# Patient Record
Sex: Female | Born: 1975 | Hispanic: Yes | Marital: Single | Smoking: Never smoker
Health system: Southern US, Community
[De-identification: ages and names within clinical notes are randomized; demographics above are authoritative.]

## PROBLEM LIST (undated history)

## (undated) DIAGNOSIS — D649 Anemia, unspecified: Secondary | ICD-10-CM

## (undated) DIAGNOSIS — O09529 Supervision of elderly multigravida, unspecified trimester: Secondary | ICD-10-CM

## (undated) DIAGNOSIS — E039 Hypothyroidism, unspecified: Secondary | ICD-10-CM

## (undated) HISTORY — PX: NO PAST SURGERIES: SHX2092

## (undated) HISTORY — DX: Supervision of elderly multigravida, unspecified trimester: O09.529

## (undated) HISTORY — DX: Anemia, unspecified: D64.9

---

## 2008-01-16 ENCOUNTER — Ambulatory Visit: Payer: Self-pay | Admitting: Sports Medicine

## 2008-01-16 ENCOUNTER — Encounter: Payer: Self-pay | Admitting: Family Medicine

## 2008-01-16 LAB — CONVERTED CEMR LAB
Antibody Screen: NEGATIVE
Basophils Relative: 0 % (ref 0–1)
Eosinophils Absolute: 0.2 10*3/uL (ref 0.0–0.7)
Hemoglobin: 11 g/dL — ABNORMAL LOW (ref 12.0–15.0)
MCHC: 31.3 g/dL (ref 30.0–36.0)
MCV: 84.4 fL (ref 78.0–100.0)
Monocytes Absolute: 0.8 10*3/uL (ref 0.1–1.0)
Monocytes Relative: 8 % (ref 3–12)
RBC: 4.16 M/uL (ref 3.87–5.11)
Sickle Cell Screen: NEGATIVE

## 2008-01-23 ENCOUNTER — Ambulatory Visit: Payer: Self-pay | Admitting: Family Medicine

## 2008-01-23 ENCOUNTER — Encounter: Payer: Self-pay | Admitting: Family Medicine

## 2008-01-23 LAB — CONVERTED CEMR LAB
Glucose, Urine, Semiquant: NEGATIVE
Protein, U semiquant: NEGATIVE

## 2008-02-23 ENCOUNTER — Ambulatory Visit: Payer: Self-pay | Admitting: Family Medicine

## 2008-02-23 ENCOUNTER — Encounter: Payer: Self-pay | Admitting: Family Medicine

## 2008-03-05 ENCOUNTER — Ambulatory Visit: Payer: Self-pay | Admitting: Family Medicine

## 2008-03-05 LAB — CONVERTED CEMR LAB: Glucose, Urine, Semiquant: NEGATIVE

## 2008-04-03 ENCOUNTER — Ambulatory Visit: Payer: Self-pay | Admitting: Family Medicine

## 2008-04-03 LAB — CONVERTED CEMR LAB: Glucose, Urine, Semiquant: NEGATIVE

## 2008-04-05 ENCOUNTER — Encounter: Payer: Self-pay | Admitting: Family Medicine

## 2008-05-08 ENCOUNTER — Ambulatory Visit: Payer: Self-pay | Admitting: Family Medicine

## 2008-05-08 LAB — CONVERTED CEMR LAB: Glucose, Urine, Semiquant: NEGATIVE

## 2008-05-29 ENCOUNTER — Encounter: Payer: Self-pay | Admitting: Family Medicine

## 2008-05-29 ENCOUNTER — Ambulatory Visit: Payer: Self-pay | Admitting: Family Medicine

## 2008-05-29 DIAGNOSIS — R635 Abnormal weight gain: Secondary | ICD-10-CM | POA: Insufficient documentation

## 2008-05-29 LAB — CONVERTED CEMR LAB
MCHC: 32 g/dL (ref 30.0–36.0)
MCV: 84 fL (ref 78.0–100.0)
RBC: 3.76 M/uL — ABNORMAL LOW (ref 3.87–5.11)

## 2008-06-04 ENCOUNTER — Encounter: Payer: Self-pay | Admitting: Family Medicine

## 2008-06-11 ENCOUNTER — Ambulatory Visit: Payer: Self-pay | Admitting: Family Medicine

## 2008-06-29 ENCOUNTER — Ambulatory Visit: Payer: Self-pay | Admitting: Family Medicine

## 2008-07-10 ENCOUNTER — Ambulatory Visit: Payer: Self-pay | Admitting: Family Medicine

## 2008-07-10 DIAGNOSIS — D649 Anemia, unspecified: Secondary | ICD-10-CM | POA: Insufficient documentation

## 2008-07-26 ENCOUNTER — Encounter: Payer: Self-pay | Admitting: Family Medicine

## 2008-07-26 ENCOUNTER — Ambulatory Visit: Payer: Self-pay | Admitting: Family Medicine

## 2008-07-26 LAB — CONVERTED CEMR LAB: GC Probe Amp, Genital: NEGATIVE

## 2008-07-27 ENCOUNTER — Encounter: Payer: Self-pay | Admitting: Family Medicine

## 2008-08-01 ENCOUNTER — Ambulatory Visit: Payer: Self-pay | Admitting: Family Medicine

## 2008-08-07 ENCOUNTER — Ambulatory Visit: Payer: Self-pay | Admitting: Family Medicine

## 2008-08-14 ENCOUNTER — Ambulatory Visit: Payer: Self-pay | Admitting: Family Medicine

## 2008-08-21 ENCOUNTER — Encounter: Payer: Self-pay | Admitting: *Deleted

## 2008-08-22 ENCOUNTER — Ambulatory Visit: Payer: Self-pay | Admitting: Obstetrics & Gynecology

## 2008-08-23 ENCOUNTER — Ambulatory Visit: Payer: Self-pay | Admitting: Family Medicine

## 2008-08-23 ENCOUNTER — Encounter: Payer: Self-pay | Admitting: *Deleted

## 2008-08-28 ENCOUNTER — Inpatient Hospital Stay (HOSPITAL_COMMUNITY): Admission: RE | Admit: 2008-08-28 | Discharge: 2008-08-31 | Payer: Self-pay | Admitting: *Deleted

## 2008-08-28 ENCOUNTER — Ambulatory Visit: Payer: Self-pay | Admitting: Family Medicine

## 2008-08-29 ENCOUNTER — Encounter: Payer: Self-pay | Admitting: Obstetrics & Gynecology

## 2010-11-24 LAB — CBC
HCT: 28.5 % — ABNORMAL LOW (ref 36.0–46.0)
MCHC: 32.3 g/dL (ref 30.0–36.0)
MCV: 76.5 fL — ABNORMAL LOW (ref 78.0–100.0)
Platelets: 201 10*3/uL (ref 150–400)
Platelets: 241 10*3/uL (ref 150–400)
RDW: 17.9 % — ABNORMAL HIGH (ref 11.5–15.5)
WBC: 9.1 10*3/uL (ref 4.0–10.5)

## 2010-11-24 LAB — RPR: RPR Ser Ql: NONREACTIVE

## 2010-12-23 NOTE — Discharge Summary (Signed)
Taylor Boyd, Taylor Boyd             ACCOUNT NO.:  [id number]   MEDICAL RECORD NO.:  [id number]          PATIENT TYPE:  INP   LOCATION:  9317                          FACILITY:  [facility]   PHYSICIAN:  Norton Blizzard, MD    DATE OF BIRTH:  [birth year]   DATE OF ADMISSION:  08/28/2008  DATE OF DISCHARGE:  08/31/2008                               DISCHARGE SUMMARY   DISCHARGE DIAGNOSES:  1. Postdates pregnancy, delivered by vacuum-assisted vaginal delivery.  2. Chorioamnionitis.  3. Anemia.   DISCHARGE MEDICATIONS:  1. Prenatal vitamin 1 daily while breast-feeding.  2. Ibuprofen 600 mg 1 every 6 hours as needed for pain.  3. Ferrous sulfate 325 mg one twice a day with food for anemia.  4. Colace 100 mg 1 twice a day for constipation.   BRIEF HOSPITAL COURSE:  Ms. Taylor Boyd was admitted on August 28, 2008, for induction of labor for postdates pregnancy.  She was 41 weeks  and 2 days gestational age, is a G56, now P2-0-1-2 female.  Induction was  begun with a Foley bulb, which was in place for 4 hours and then did  fall out with gentle traction.  Pitocin augmentation was then begun.  The patient was GBS positive and was treated appropriately with  intrapartum penicillin throughout her labor.  During labor, she did end  up developing a fever as high as 101.8.  Ampicillin and gentamicin were  initiated.  The patient reached complete dilation approximately midnight  to 1:00 a.m. on the morning of August 29, 2008.  Around 3:34 in the  morning on August 29, 2008, the baby did begin to show signs of fetal  distress with deep variable decelerations as well as occasional late  decelerations.  Baby also developed fetal tachycardia, we believe  secondary to the mother's fever, however, it did reach as high as to  180s and did remain there.  Due to prolonged second stage labor and  fetal distress, the decision for a vacuum-assisted vaginal delivery was  made.  Please see the delivery note for  full details of the vacuum-  assisted vaginal delivery.  The infant was delivered.  NICU was on hand  due to moderate meconium present at the rupture of her membranes as well  as the signs of fetal distress.  Due to some difficulty breathing and  floppy tone, the infant was taken up to the NICU following delivery.  Ms. Taylor Boyd did have a second-degree perineal laceration which was  repaired using 2-0 Vicryl and blood loss was minimal.  Placenta was  spontaneously delivered intact.  She was transferred to the postpartum  floor and had received routine postpartum care with the addition of iron  sulfate for her postpartum anemia.  On the day of discharge, the patient  has no complaints.  Her pain is well controlled with ibuprofen.  She is  ambulating and eating without any difficulty.  She denies any nausea or  dizziness.   LABS:  Prior to discharge, showed a hemoglobin of 8.1 and hematocrit of  25.1.   DISCHARGE:  The patient will be discharged  home.   CONDITION:  Stable.   FOLLOWUP:  She will see Dr. Ardeen Garland at the [facility] in 6 weeks for her routine postpartum examination.      Ardeen Garland, MD      Norton Blizzard, MD  Electronically Signed    LM/MEDQ  D:  08/31/2008  T:  08/31/2008  Job:  [id number] [id number]

## 2010-12-23 NOTE — Op Note (Signed)
[name], [name]             ACCOUNT NO.:  [id number]   MEDICAL RECORD NO.:  [id number]          PATIENT TYPE:  INP   LOCATION:  9170                          [facility]ILITY:  [facility]   PHYSICIAN:  Norton Blizzard, MD    DATE OF BIRTH:  10-Jan-1976   DATE OF PROCEDURE:  DATE OF DISCHARGE:                               OPERATIVE REPORT   DELIVERY NOTE   DATE OF DELIVERY:  August 29, 2008.   ATTENDING:  Dr. Vela Prose Duru who was present for delivery and repair.   DESCRIPTION OF DELIVERY:  Ms. Taylor Boyd was admitted for induction  of labor secondary to 22 weeks' gestational age.  She progressed over a  prolonged course to complete and after 1-1/2 hours of pushing efforts,  the baby was not descending adequately from the +1 station.  The patient  was also noted to be febrile to 101, and had fetal tachycardia to 180s.  Due to her prolonged second stage, failure to descend and fetal  tachycardia, the patient was counseled on the risks and benefits of a  vacuum-assisted vaginal delivery.  The patient voiced understands of  these risks and desired to proceed with this delivery.  Appropriate  anesthesia was confirmed with the patient as the patient had epidural  anesthesia.  A Foley catheter was in place.  The fetal presentation was  occiput anterior.  The Kiwi vacuum was placed in appropriate location  over the sagittal suture just anterior to the posterior fontanelle.  Over the course of 6 contractions with good maternal pushing efforts,  baby's head was descended until it delivered in a LOA presentation.  There were no pop offs during this time and the use of the vacuum was  over 14 minutes.  The vacuum was removed and a loose double nuchal cord  was noted, which was manually reduced.  The anterior shoulder then  required McRoberts maneuver to be delivered.  It took approximately 25  seconds for delivery of the anterior shoulder.  The posterior shoulder  followed by rest of corpus  delivered without complication at this point.  The mouth and nares were bulb suctioned, and the cord was clamped and  cut and the baby was handed to the awaiting NICU staff.  Apgars were 3  and 9.  Cord arterial pH was collected, which was 7.24.  Cord blood was  sent to lab.  A second-degree laceration was noted, which extended down  to the anal sphincter, but did not involve the sphincter.  The sphincter  was reinforced with 1 figure-of-eight suture with 2-0 Vicryl.  The  second-degree midline laceration was then repaired with 2-0 Vicryl in  the usual manner.  The placenta then delivered after vigorous fundal  massage and was intact.  There was a significant amount of trailing  membranes noted.  After delivery of the membranes, manual sweep at the  uterus revealed small amount of retained membranes that were easily  removed.  The uterus was then noted to be quite firm after  administration of Pitocin bolus.  Estimated blood loss is  approximately 500 mL.  The patient  tolerated the procedure well.  Baby  was taken to the NICU for further observation.  Mother was in good  condition at the conclusion of this delivery.  She was started on  antibiotics during her delivery and will remain on the antiobiotics  until she is afebrile for 24 hours.      Odie Sera, DO  Electronically Signed     ______________________________  Norton Blizzard, MD    MC/MEDQ  D:  08/29/2008  T:  08/29/2008  Job:  [id number]

## 2011-05-12 LAB — GLUCOSE, CAPILLARY: Glucose-Capillary: 88

## 2013-05-29 ENCOUNTER — Ambulatory Visit: Payer: Self-pay | Admitting: Physician Assistant

## 2013-05-29 VITALS — BP 125/82 | HR 77 | Temp 98.5°F | Resp 18 | Ht 64.0 in | Wt 195.0 lb

## 2013-05-29 DIAGNOSIS — Z0000 Encounter for general adult medical examination without abnormal findings: Secondary | ICD-10-CM

## 2013-05-29 DIAGNOSIS — D649 Anemia, unspecified: Secondary | ICD-10-CM

## 2013-05-29 DIAGNOSIS — Z01419 Encounter for gynecological examination (general) (routine) without abnormal findings: Secondary | ICD-10-CM

## 2013-05-29 LAB — POCT CBC
Granulocyte percent: 64.6 %G (ref 37–80)
Hemoglobin: 11.4 g/dL — AB (ref 12.2–16.2)
MCH, POC: 27.8 pg (ref 27–31.2)
MCV: 90.3 fL (ref 80–97)
Platelet Count, POC: 294 10*3/uL (ref 142–424)
RBC: 4.1 M/uL (ref 4.04–5.48)
WBC: 9.4 10*3/uL (ref 4.6–10.2)

## 2013-05-29 LAB — POCT URINALYSIS DIPSTICK
Bilirubin, UA: NEGATIVE
Blood, UA: NEGATIVE
Glucose, UA: NEGATIVE
Nitrite, UA: NEGATIVE
Spec Grav, UA: 1.015
Urobilinogen, UA: 0.2

## 2013-05-29 NOTE — Progress Notes (Signed)
   [address] [place] [address]   Phone [phone]  Subjective:    Patient ID: Taylor Boyd, female    DOB: November 05, 1975, 37 y.o.   MRN: [id number]  HPI  Pt presents to clinic for CPE.  She is not currently having any problems or concerns. Her last pap was about 3 years ago and it was normal - she has never had an abnormal exam.  She does SBE and has noticed nothing.  She has never had a mammogram.  She does not see the dentist or the eye dr.  Review of Systems  Constitutional: Negative.   HENT: Negative.   Eyes: Negative.   Respiratory: Negative.   Cardiovascular: Negative.   Gastrointestinal: Negative.   Endocrine: Negative.   Genitourinary: Negative.   Musculoskeletal: Negative.   Skin: Negative.   Allergic/Immunologic: Negative.   Neurological: Negative.   Hematological: Negative.   Psychiatric/Behavioral: Negative.        Objective:   Physical Exam  Vitals reviewed. Constitutional: She is oriented to person, place, and time. She appears well-developed and well-nourished.  HENT:  Head: Normocephalic and atraumatic.  Right Ear: Hearing, tympanic membrane, external ear and ear canal normal.  Left Ear: Hearing, tympanic membrane, external ear and ear canal normal.  Nose: Nose normal.  Mouth/Throat: Uvula is midline, oropharynx is clear and moist and mucous membranes are normal.  Eyes: Conjunctivae are normal. Pupils are equal, round, and reactive to light.  Neck: Normal range of motion. Neck supple. No thyromegaly present.  Cardiovascular: Normal rate, regular rhythm and normal heart sounds.   Pulmonary/Chest: Effort normal and breath sounds normal.  Abdominal: Soft. Bowel sounds are normal.  Genitourinary: Vagina normal and uterus normal. No breast swelling, tenderness, discharge or bleeding. Pelvic exam was performed with patient supine. No labial fusion. There is no rash, tenderness, lesion or injury on the right labia. There is no rash, tenderness, lesion or  injury on the left labia. Cervix exhibits no motion tenderness, no discharge and no friability. Right adnexum displays no mass, no tenderness and no fullness. Left adnexum displays no mass, no tenderness and no fullness.  Musculoskeletal: Normal range of motion.  Lymphadenopathy:    She has no cervical adenopathy.  Neurological: She is alert and oriented to person, place, and time. She has normal reflexes.  Skin: Skin is warm and dry.  Psychiatric: She has a normal mood and affect. Her behavior is normal. Judgment and thought content normal.       Assessment & Plan:  Annual physical exam - Plan: POCT CBC, POCT urinalysis dipstick, Comprehensive metabolic panel, Lipid panel, TSH  Encounter for routine gynecological examination - Plan: Pap IG and HPV (high risk) DNA detection  Anemia - improved from last visit - pt will use MVI with iron.  Benny Lennert PA-C 05/29/2013 8:49 PM

## 2013-05-30 LAB — TSH: TSH: 0.944 u[IU]/mL (ref 0.350–4.500)

## 2013-05-30 LAB — COMPREHENSIVE METABOLIC PANEL
AST: 15 U/L (ref 0–37)
Albumin: 4.3 g/dL (ref 3.5–5.2)
Alkaline Phosphatase: 89 U/L (ref 39–117)
BUN: 13 mg/dL (ref 6–23)
Calcium: 8.5 mg/dL (ref 8.4–10.5)
Chloride: 104 mEq/L (ref 96–112)
Glucose, Bld: 92 mg/dL (ref 70–99)
Potassium: 3.8 mEq/L (ref 3.5–5.3)
Sodium: 137 mEq/L (ref 135–145)
Total Protein: 6.9 g/dL (ref 6.0–8.3)

## 2013-05-30 LAB — LIPID PANEL: LDL Cholesterol: 82 mg/dL (ref 0–99)

## 2013-06-01 LAB — PAP IG AND HPV HIGH-RISK

## 2013-06-04 ENCOUNTER — Encounter: Payer: Self-pay | Admitting: Family Medicine

## 2014-07-23 ENCOUNTER — Other Ambulatory Visit (HOSPITAL_COMMUNITY): Payer: Self-pay | Admitting: Urology

## 2014-07-23 DIAGNOSIS — O09522 Supervision of elderly multigravida, second trimester: Secondary | ICD-10-CM

## 2014-07-23 DIAGNOSIS — Z3689 Encounter for other specified antenatal screening: Secondary | ICD-10-CM

## 2014-07-23 LAB — OB RESULTS CONSOLE HEPATITIS B SURFACE ANTIGEN: Hepatitis B Surface Ag: NEGATIVE

## 2014-07-23 LAB — OB RESULTS CONSOLE RPR: RPR: NONREACTIVE

## 2014-07-23 LAB — OB RESULTS CONSOLE RUBELLA ANTIBODY, IGM: Rubella: IMMUNE

## 2014-07-23 LAB — OB RESULTS CONSOLE ANTIBODY SCREEN: Antibody Screen: NEGATIVE

## 2014-07-23 LAB — OB RESULTS CONSOLE ABO/RH: RH Type: POSITIVE

## 2014-07-23 LAB — OB RESULTS CONSOLE GC/CHLAMYDIA
Chlamydia: NEGATIVE
GC PROBE AMP, GENITAL: NEGATIVE

## 2014-07-23 LAB — OB RESULTS CONSOLE HIV ANTIBODY (ROUTINE TESTING): HIV: NONREACTIVE

## 2014-08-10 NOTE — L&D Delivery Note (Signed)
Delivery Note At 9:32 AM a viable female was delivered via Vaginal, Spontaneous Delivery (Presentation: Vertex; Occiput Anterior).  APGAR: 7, 9; weight pending.   Placenta status: Intact, Spontaneous.  Cord: 3 vessels with the following complications:  Anterior shoulder dystocia with compound hand x 1 minute. Reduced with McRobert's maneuver, subrapubic pressure and delivery of posterior shoulder.   Anesthesia: Epidural  Episiotomy: None Lacerations: 1st degree Suture Repair: 3.0 vicryl with good hemostasis Est. Blood Loss (mL):  100  Mom to postpartum.  Baby to Couplet care / Skin to Skin.  Delivery supervised and assisted by Joellyn HaffKim Booker, CNM  Fabio AsaMonica T Karrina Lye 01/12/2015, 10:03 AM

## 2014-08-13 ENCOUNTER — Encounter (HOSPITAL_COMMUNITY): Payer: Self-pay

## 2014-08-13 ENCOUNTER — Ambulatory Visit (HOSPITAL_COMMUNITY)
Admission: RE | Admit: 2014-08-13 | Discharge: 2014-08-13 | Disposition: A | Discharge status: Home / Self Care | Payer: Medicaid Other | Source: Ambulatory Visit | Attending: Urology | Admitting: Urology

## 2014-08-13 DIAGNOSIS — O09529 Supervision of elderly multigravida, unspecified trimester: Secondary | ICD-10-CM | POA: Insufficient documentation

## 2014-08-13 DIAGNOSIS — O09522 Supervision of elderly multigravida, second trimester: Secondary | ICD-10-CM

## 2014-08-13 DIAGNOSIS — Z3A19 19 weeks gestation of pregnancy: Secondary | ICD-10-CM | POA: Insufficient documentation

## 2014-08-13 DIAGNOSIS — Z36 Encounter for antenatal screening of mother: Secondary | ICD-10-CM | POA: Insufficient documentation

## 2014-08-13 DIAGNOSIS — Z3689 Encounter for other specified antenatal screening: Secondary | ICD-10-CM | POA: Insufficient documentation

## 2014-08-20 ENCOUNTER — Other Ambulatory Visit (HOSPITAL_COMMUNITY): Payer: Self-pay | Admitting: Nurse Practitioner

## 2014-08-20 DIAGNOSIS — Z0489 Encounter for examination and observation for other specified reasons: Secondary | ICD-10-CM

## 2014-08-20 DIAGNOSIS — IMO0002 Reserved for concepts with insufficient information to code with codable children: Secondary | ICD-10-CM

## 2014-09-07 ENCOUNTER — Ambulatory Visit (HOSPITAL_COMMUNITY)
Admission: RE | Admit: 2014-09-07 | Discharge: 2014-09-07 | Disposition: A | Discharge status: Home / Self Care | Payer: Medicaid Other | Source: Ambulatory Visit | Attending: Nurse Practitioner | Admitting: Nurse Practitioner

## 2014-09-07 DIAGNOSIS — IMO0002 Reserved for concepts with insufficient information to code with codable children: Secondary | ICD-10-CM

## 2014-09-07 DIAGNOSIS — Z36 Encounter for antenatal screening of mother: Secondary | ICD-10-CM | POA: Diagnosis not present

## 2014-09-07 DIAGNOSIS — Z0489 Encounter for examination and observation for other specified reasons: Secondary | ICD-10-CM

## 2014-12-10 LAB — OB RESULTS CONSOLE GC/CHLAMYDIA
CHLAMYDIA, DNA PROBE: NEGATIVE
Gonorrhea: NEGATIVE

## 2014-12-10 LAB — OB RESULTS CONSOLE GBS: GBS: NEGATIVE

## 2015-01-08 ENCOUNTER — Other Ambulatory Visit (HOSPITAL_COMMUNITY): Payer: Self-pay | Admitting: Nurse Practitioner

## 2015-01-08 DIAGNOSIS — O480 Post-term pregnancy: Secondary | ICD-10-CM

## 2015-01-09 ENCOUNTER — Encounter (HOSPITAL_COMMUNITY): Payer: Self-pay | Admitting: *Deleted

## 2015-01-09 ENCOUNTER — Telehealth (HOSPITAL_COMMUNITY): Payer: Self-pay | Admitting: *Deleted

## 2015-01-09 NOTE — Telephone Encounter (Signed)
Preadmission screen Interpreter number [phone]

## 2015-01-11 ENCOUNTER — Encounter (HOSPITAL_COMMUNITY): Payer: Self-pay | Admitting: *Deleted

## 2015-01-11 ENCOUNTER — Inpatient Hospital Stay (HOSPITAL_COMMUNITY)
Admission: AD | Admit: 2015-01-11 | Discharge: 2015-01-13 | DRG: 774 | Disposition: A | Discharge status: Home / Self Care | Payer: Medicaid Other | Source: Ambulatory Visit | Attending: Obstetrics and Gynecology | Admitting: Obstetrics and Gynecology

## 2015-01-11 ENCOUNTER — Ambulatory Visit (HOSPITAL_COMMUNITY): Payer: Medicaid Other

## 2015-01-11 DIAGNOSIS — R030 Elevated blood-pressure reading, without diagnosis of hypertension: Secondary | ICD-10-CM | POA: Diagnosis present

## 2015-01-11 DIAGNOSIS — Z3A40 40 weeks gestation of pregnancy: Secondary | ICD-10-CM | POA: Diagnosis present

## 2015-01-11 DIAGNOSIS — O480 Post-term pregnancy: Secondary | ICD-10-CM | POA: Diagnosis present

## 2015-01-11 DIAGNOSIS — O1092 Unspecified pre-existing hypertension complicating childbirth: Secondary | ICD-10-CM | POA: Diagnosis present

## 2015-01-11 DIAGNOSIS — O163 Unspecified maternal hypertension, third trimester: Secondary | ICD-10-CM | POA: Diagnosis present

## 2015-01-11 DIAGNOSIS — O09523 Supervision of elderly multigravida, third trimester: Secondary | ICD-10-CM | POA: Diagnosis not present

## 2015-01-11 LAB — URINALYSIS, ROUTINE W REFLEX MICROSCOPIC
Bilirubin Urine: NEGATIVE
Glucose, UA: NEGATIVE mg/dL
HGB URINE DIPSTICK: NEGATIVE
KETONES UR: NEGATIVE mg/dL
Leukocytes, UA: NEGATIVE
Nitrite: NEGATIVE
PH: 6 (ref 5.0–8.0)
PROTEIN: NEGATIVE mg/dL
Specific Gravity, Urine: >1.03 — ABNORMAL HIGH (ref 1.005–1.030)
Urobilinogen, UA: 0.2 mg/dL (ref 0.0–1.0)

## 2015-01-11 LAB — COMPREHENSIVE METABOLIC PANEL
ALBUMIN: 3 g/dL — AB (ref 3.5–5.0)
ALK PHOS: 142 U/L — AB (ref 38–126)
ALT: 16 U/L (ref 14–54)
ANION GAP: 7 (ref 5–15)
AST: 19 U/L (ref 15–41)
BUN: 10 mg/dL (ref 6–20)
CALCIUM: 8.6 mg/dL — AB (ref 8.9–10.3)
CO2: 21 mmol/L — ABNORMAL LOW (ref 22–32)
Chloride: 107 mmol/L (ref 101–111)
Creatinine, Ser: 0.44 mg/dL (ref 0.44–1.00)
GFR calc Af Amer: >60 mL/min (ref >60)
GFR calc non Af Amer: >60 mL/min (ref >60)
Glucose, Bld: 86 mg/dL (ref 65–99)
POTASSIUM: 3.7 mmol/L (ref 3.5–5.1)
Sodium: 135 mmol/L (ref 135–145)
Total Bilirubin: 0.4 mg/dL (ref 0.3–1.2)
Total Protein: 6.2 g/dL — ABNORMAL LOW (ref 6.5–8.1)

## 2015-01-11 LAB — CBC
HEMATOCRIT: 33.1 % — AB (ref 36.0–46.0)
HEMOGLOBIN: 11 g/dL — AB (ref 12.0–15.0)
MCH: 28.8 pg (ref 26.0–34.0)
MCHC: 33.2 g/dL (ref 30.0–36.0)
MCV: 86.6 fL (ref 78.0–100.0)
PLATELETS: 213 10*3/uL (ref 150–400)
RBC: 3.82 MIL/uL — AB (ref 3.87–5.11)
RDW: 14.7 % (ref 11.5–15.5)
WBC: 9 10*3/uL (ref 4.0–10.5)

## 2015-01-11 LAB — TYPE AND SCREEN
ABO/RH(D): O POS
ANTIBODY SCREEN: NEGATIVE

## 2015-01-11 LAB — PROTEIN / CREATININE RATIO, URINE
CREATININE, URINE: 116 mg/dL
PROTEIN CREATININE RATIO: 0.17 mg/mg{creat} — AB (ref 0.00–0.15)
TOTAL PROTEIN, URINE: 20 mg/dL

## 2015-01-11 LAB — ABO/RH: ABO/RH(D): O POS

## 2015-01-11 MED ORDER — TERBUTALINE SULFATE 1 MG/ML IJ SOLN: 0.2500 mg | Freq: Once | INTRAMUSCULAR | Status: AC | PRN

## 2015-01-11 MED ORDER — LIDOCAINE HCL (PF) 1 % IJ SOLN
30.0000 mL | INTRAMUSCULAR | Status: DC | PRN
  Filled 2015-01-11: qty 30

## 2015-01-11 MED ORDER — ACETAMINOPHEN 325 MG PO TABS: 650.0000 mg | ORAL_TABLET | ORAL | Status: DC | PRN

## 2015-01-11 MED ORDER — OXYCODONE-ACETAMINOPHEN 5-325 MG PO TABS: 1.0000 | ORAL_TABLET | ORAL | Status: DC | PRN

## 2015-01-11 MED ORDER — MISOPROSTOL 25 MCG QUARTER TABLET
25.0000 ug | ORAL_TABLET | ORAL | Status: DC | PRN
  Administered 2015-01-11: 25 ug via VAGINAL
  Filled 2015-01-11: qty 0.25
  Filled 2015-01-11: qty 1

## 2015-01-11 MED ORDER — LACTATED RINGERS IV SOLN
INTRAVENOUS | Status: DC
  Administered 2015-01-11 – 2015-01-12 (×3): via INTRAVENOUS

## 2015-01-11 MED ORDER — OXYCODONE-ACETAMINOPHEN 5-325 MG PO TABS: 2.0000 | ORAL_TABLET | ORAL | Status: DC | PRN

## 2015-01-11 MED ORDER — CITRIC ACID-SODIUM CITRATE 334-500 MG/5ML PO SOLN: 30.0000 mL | ORAL | Status: DC | PRN

## 2015-01-11 MED ORDER — LACTATED RINGERS IV SOLN
500.0000 mL | INTRAVENOUS | Status: DC | PRN
  Administered 2015-01-12: 500 mL via INTRAVENOUS

## 2015-01-11 MED ORDER — FENTANYL CITRATE (PF) 100 MCG/2ML IJ SOLN
50.0000 ug | INTRAMUSCULAR | Status: DC | PRN
  Administered 2015-01-11: 50 ug via INTRAVENOUS
  Administered 2015-01-12: 75 ug via INTRAVENOUS
  Filled 2015-01-11 (×2): qty 2

## 2015-01-11 MED ORDER — OXYTOCIN 40 UNITS IN LACTATED RINGERS INFUSION - SIMPLE MED
1.0000 m[IU]/min | INTRAVENOUS | Status: DC
  Administered 2015-01-11: 2 m[IU]/min via INTRAVENOUS
  Administered 2015-01-12: 6 m[IU]/min via INTRAVENOUS
  Filled 2015-01-11: qty 1000

## 2015-01-11 MED ORDER — OXYTOCIN 40 UNITS IN LACTATED RINGERS INFUSION - SIMPLE MED: 62.5000 mL/h | INTRAVENOUS | Status: DC

## 2015-01-11 MED ORDER — OXYTOCIN BOLUS FROM INFUSION: 500.0000 mL | INTRAVENOUS | Status: DC

## 2015-01-11 MED ORDER — ONDANSETRON HCL 4 MG/2ML IJ SOLN: 4.0000 mg | Freq: Four times a day (QID) | INTRAMUSCULAR | Status: DC | PRN

## 2015-01-11 NOTE — Progress Notes (Signed)
Patient ID: Taylor GreenMaria Boyd, female   DOB: 05/15/1976, 39 y.o.   MRN: [id number] S: Patient reporting mild contractions. No LOF or VB. Feeling regular fetal movement.   O:  Filed Vitals:   01/11/15 1746  BP: 127/85  Pulse: 88  Temp:   Resp: 20   Dilation: 4 Effacement (%): 40 Cervical Position: Posterior Presentation: Vertex Exam by:: dr Delanna AhmadiLawler [place]  -3   A?P:   39 y.o. [id number] at 2929w5d for induction of labor. Progressing well after cytotec Contractions q2-5 minutes, mild in nature Cervix 4 cm, 40%, vertex.  Bishop score 7 Will start pitocin due to mild contractions for cont induction of labor.   Plan of care discussed with Dorathy KinsmanVirginia Smith, CNM

## 2015-01-11 NOTE — H&P (Signed)
Taylor GreenMaria Boyd is a 39 y.o. female, [id number] presenting at 8210w5d by LMP consistent with 19 wk ultrasound with high blood pressure (140s/80s) at clinic and increased pedal edema. She has not previously had elevated blood pressures. She denies any symptoms of preeclampsia with no headache, mental status change, blurry vision, RUQ, N or vomiting. She has had a little bit of increased swelling in her feet. She continues to feel regular fetal movement. Denies any leakage of fluid or vaginal bleeding.   Maternal Medical History:  Reason for admission: Nausea.    OB History    Gravida Para Term Preterm AB TAB SAB Ectopic Multiple Living   4 2 2  1  1   2      Past Medical History  Diagnosis Date  . Anemia   . AMA (advanced maternal age) multigravida 35+    History reviewed. No pertinent past surgical history. Family History: family history is negative for Alcohol abuse, Arthritis, Asthma, Birth defects, Cancer, COPD, Depression, Diabetes, Drug abuse, Early death, Hearing loss, Heart disease, Hyperlipidemia, Hypertension, Kidney disease, Learning disabilities, Mental illness, Mental retardation, Miscarriages / Stillbirths, Vision loss, Stroke, and Varicose Veins. Social History:  reports that she has never smoked. She does not have any smokeless tobacco history on file. She reports that she does not drink alcohol or use illicit drugs.   Prenatal Transfer Tool  Maternal Diabetes: No Genetic Screening: Normal Maternal Ultrasounds/Referrals: Normal, echogenic focus on LV Fetal Ultrasounds or other Referrals:  None Maternal Substance Abuse:  No Significant Maternal Medications:  None Significant Maternal Lab Results:  Lab values include: Group B Strep negative Other Comments:  None  Review of Systems  Constitutional: Negative for fever and chills.  Eyes: Negative for blurred vision, double vision and photophobia.  Respiratory: Negative for cough and shortness of breath.   Cardiovascular:  Positive for leg swelling. Negative for chest pain.  Gastrointestinal: Negative for nausea, vomiting and abdominal pain.  Genitourinary: Negative for dysuria.  Neurological: Negative for headaches.    Dilation: 2 Effacement (%): Thick Exam by:: V Smith Blood pressure 122/62, pulse 86, temperature 99.1 F (37.3 C), temperature source Oral, resp. rate 16, height 5\' 5"  (1.651 m), weight 90.89 kg (200 lb 6 oz), last menstrual period 04/01/2014. Maternal Exam:  Uterine Assessment: Contractions absent  Abdomen: Patient reports no abdominal tenderness. Fetal presentation: vertex  Introitus: Normal vulva. Normal vagina.    Fetal Exam Fetal Monitor Review: Mode: ultrasound.   Baseline rate: 140.  Variability: moderate (6-25 bpm).   Pattern: accelerations present.       Filed Vitals:   01/11/15 1117  BP: 122/62  Pulse: 86  Temp:   Resp:     Physical Exam  Constitutional: She is oriented to person, place, and time. She appears well-developed and well-nourished.  HENT:  Head: Normocephalic.  Eyes: Conjunctivae are normal.  Cardiovascular: Normal rate, regular rhythm and normal heart sounds.   1+ non pitting edema bilateral feet  Respiratory: Effort normal and breath sounds normal. No respiratory distress. She has no wheezes.  GI: Soft. There is no tenderness.  Neurological: She is alert and oriented to person, place, and time.  Achilles DTR 2+ without clonus  Skin: Skin is warm and dry.  Psychiatric: She has a normal mood and affect.    Prenatal labs: ABO, Rh: O/Positive/-- (12/14 0000) Antibody: Negative (12/14 0000) Rubella: Immune (12/14 0000) RPR: Nonreactive (12/14 0000)  HBsAg: Negative (12/14 0000)  HIV: Non-reactive (12/14 0000)  GBS: Negative (05/02 0000)  Assessment/Plan: A: 39 yo G4P2012 at [redacted]w[redacted]d by LMP consistent with 19 week ultrasound with increased BP and pedal edema in clinic. BP within normal limits on admission. Urine protein:creatinine ratio is not  significantly elevated. SVE 2 cm, thick, soft. Given post-dates and increased swelling, will admit for induction of labor.   P:  Admit to birthing suite Close BP monitoring Cervical ripening with vaginal cytotec May eat now and then clear liquid diet Bedrest with bathroom privileges Fentanyl IV for severe pain Cont expectant management   Patient history, exam, assessment and plan discussed with Dorathy Kinsman, CNM.  Fabio Asa 01/11/2015, 11:50 AM  I was present for the exam and agree with above.  [place], [place] 01/11/2015 12:46 PM

## 2015-01-11 NOTE — Progress Notes (Signed)
Patient ID: Leonie GreenMaria Hernandez, female   DOB: 12/02/1975, 39 y.o.   MRN: [id number] Doing well but starting to feel more pain with contractions  Filed Vitals:   01/11/15 1949 01/11/15 2003 01/11/15 2033 01/11/15 2103  BP: 138/87 128/84 134/86 130/78  Pulse: 84 90 78 80  Temp:  98.8 F (37.1 C)    TempSrc:  Oral    Resp:  20    Height:      Weight:       FHR reassuring with small accels and average variability UCs every 2 min  Dilation: 4 Effacement (%): 40 Cervical Position: Posterior Presentation: Vertex Exam by:: dr Delanna AhmadiLawler  Exam deferred for now  .

## 2015-01-11 NOTE — MAU Note (Signed)
Urine in lab 

## 2015-01-11 NOTE — MAU Note (Signed)
[facility] with elevated BP.

## 2015-01-11 NOTE — Progress Notes (Signed)
I assisted Taylor BoydDelanna AhmadiLawler (resident) with some questions and explanation of plan of care By Orlan LeavensViria Alvarez Spanish Interpreter

## 2015-01-11 NOTE — MAU Provider Note (Signed)
Chief Complaint:  Hypertension   See H&P. ROS  Taylor KinsmanVirginia Gwendolyne Boyd, CNM 01/11/2015 10:54 AM

## 2015-01-11 NOTE — MAU Note (Signed)
[facility] and increased swelling in feet.

## 2015-01-12 ENCOUNTER — Inpatient Hospital Stay (HOSPITAL_COMMUNITY): Payer: Medicaid Other | Admitting: Anesthesiology

## 2015-01-12 ENCOUNTER — Encounter (HOSPITAL_COMMUNITY): Payer: Self-pay | Admitting: *Deleted

## 2015-01-12 DIAGNOSIS — Z3A40 40 weeks gestation of pregnancy: Secondary | ICD-10-CM

## 2015-01-12 DIAGNOSIS — O480 Post-term pregnancy: Secondary | ICD-10-CM

## 2015-01-12 DIAGNOSIS — O09523 Supervision of elderly multigravida, third trimester: Secondary | ICD-10-CM

## 2015-01-12 DIAGNOSIS — O1092 Unspecified pre-existing hypertension complicating childbirth: Secondary | ICD-10-CM

## 2015-01-12 LAB — CBC
HCT: 32.4 % — ABNORMAL LOW (ref 36.0–46.0)
HEMATOCRIT: 35.4 % — AB (ref 36.0–46.0)
HEMOGLOBIN: 12 g/dL (ref 12.0–15.0)
Hemoglobin: 10.8 g/dL — ABNORMAL LOW (ref 12.0–15.0)
MCH: 29.8 pg (ref 26.0–34.0)
MCH: 29 pg (ref 26.0–34.0)
MCHC: 33.9 g/dL (ref 30.0–36.0)
MCHC: 33.3 g/dL (ref 30.0–36.0)
MCV: 87.8 fL (ref 78.0–100.0)
MCV: 86.9 fL (ref 78.0–100.0)
Platelets: 208 10*3/uL (ref 150–400)
Platelets: 182 10*3/uL (ref 150–400)
RBC: 4.03 MIL/uL (ref 3.87–5.11)
RBC: 3.73 MIL/uL — AB (ref 3.87–5.11)
RDW: 14.9 % (ref 11.5–15.5)
RDW: 14.6 % (ref 11.5–15.5)
WBC: 11.6 10*3/uL — ABNORMAL HIGH (ref 4.0–10.5)
WBC: 14.3 10*3/uL — ABNORMAL HIGH (ref 4.0–10.5)

## 2015-01-12 MED ORDER — TETANUS-DIPHTH-ACELL PERTUSSIS 5-2.5-18.5 LF-MCG/0.5 IM SUSP: 0.5000 mL | Freq: Once | INTRAMUSCULAR | Status: DC

## 2015-01-12 MED ORDER — ONDANSETRON HCL 4 MG PO TABS: 4.0000 mg | ORAL_TABLET | ORAL | Status: DC | PRN

## 2015-01-12 MED ORDER — LANOLIN HYDROUS EX OINT: TOPICAL_OINTMENT | CUTANEOUS | Status: DC | PRN

## 2015-01-12 MED ORDER — EPHEDRINE 5 MG/ML INJ
10.0000 mg | INTRAVENOUS | Status: DC | PRN
  Filled 2015-01-12: qty 2

## 2015-01-12 MED ORDER — SENNOSIDES-DOCUSATE SODIUM 8.6-50 MG PO TABS
2.0000 | ORAL_TABLET | ORAL | Status: DC
  Administered 2015-01-12: 2 via ORAL
  Filled 2015-01-12: qty 2

## 2015-01-12 MED ORDER — FENTANYL 2.5 MCG/ML BUPIVACAINE 1/10 % EPIDURAL INFUSION (WH - ANES): 14.0000 mL/h | INTRAMUSCULAR | Status: DC | PRN

## 2015-01-12 MED ORDER — DIPHENHYDRAMINE HCL 25 MG PO CAPS: 25.0000 mg | ORAL_CAPSULE | Freq: Four times a day (QID) | ORAL | Status: DC | PRN

## 2015-01-12 MED ORDER — ONDANSETRON HCL 4 MG/2ML IJ SOLN: 4.0000 mg | INTRAMUSCULAR | Status: DC | PRN

## 2015-01-12 MED ORDER — OXYCODONE-ACETAMINOPHEN 5-325 MG PO TABS: 2.0000 | ORAL_TABLET | ORAL | Status: DC | PRN

## 2015-01-12 MED ORDER — ZOLPIDEM TARTRATE 5 MG PO TABS: 5.0000 mg | ORAL_TABLET | Freq: Every evening | ORAL | Status: DC | PRN

## 2015-01-12 MED ORDER — DIPHENHYDRAMINE HCL 50 MG/ML IJ SOLN: 12.5000 mg | INTRAMUSCULAR | Status: DC | PRN

## 2015-01-12 MED ORDER — LIDOCAINE HCL (PF) 1 % IJ SOLN
INTRAMUSCULAR | Status: DC | PRN
  Administered 2015-01-12: 4 mL
  Administered 2015-01-12: 6 mL

## 2015-01-12 MED ORDER — OXYCODONE-ACETAMINOPHEN 5-325 MG PO TABS: 1.0000 | ORAL_TABLET | ORAL | Status: DC | PRN

## 2015-01-12 MED ORDER — PRENATAL MULTIVITAMIN CH
1.0000 | ORAL_TABLET | Freq: Every day | ORAL | Status: DC
  Administered 2015-01-13: 1 via ORAL
  Filled 2015-01-12: qty 1

## 2015-01-12 MED ORDER — DIBUCAINE 1 % RE OINT: 1.0000 "application " | TOPICAL_OINTMENT | RECTAL | Status: DC | PRN

## 2015-01-12 MED ORDER — WITCH HAZEL-GLYCERIN EX PADS
1.0000 | MEDICATED_PAD | CUTANEOUS | Status: DC | PRN
Start: 2015-01-12 — End: 2015-01-13

## 2015-01-12 MED ORDER — FENTANYL 2.5 MCG/ML BUPIVACAINE 1/10 % EPIDURAL INFUSION (WH - ANES)
14.0000 mL/h | INTRAMUSCULAR | Status: DC | PRN
  Administered 2015-01-12: 14 mL/h via EPIDURAL
  Filled 2015-01-12: qty 125

## 2015-01-12 MED ORDER — IBUPROFEN 600 MG PO TABS
600.0000 mg | ORAL_TABLET | Freq: Four times a day (QID) | ORAL | Status: DC
  Administered 2015-01-12 – 2015-01-13 (×3): 600 mg via ORAL
  Filled 2015-01-12 (×4): qty 1

## 2015-01-12 MED ORDER — BENZOCAINE-MENTHOL 20-0.5 % EX AERO
1.0000 "application " | INHALATION_SPRAY | CUTANEOUS | Status: DC | PRN
  Filled 2015-01-12: qty 56

## 2015-01-12 MED ORDER — SIMETHICONE 80 MG PO CHEW: 80.0000 mg | CHEWABLE_TABLET | ORAL | Status: DC | PRN

## 2015-01-12 MED ORDER — PHENYLEPHRINE 40 MCG/ML (10ML) SYRINGE FOR IV PUSH (FOR BLOOD PRESSURE SUPPORT)
80.0000 ug | PREFILLED_SYRINGE | INTRAVENOUS | Status: DC | PRN
  Administered 2015-01-12: 80 ug via INTRAVENOUS
  Filled 2015-01-12: qty 20
  Filled 2015-01-12: qty 2

## 2015-01-12 MED ORDER — ACETAMINOPHEN 325 MG PO TABS: 650.0000 mg | ORAL_TABLET | ORAL | Status: DC | PRN

## 2015-01-12 NOTE — Progress Notes (Signed)
Patient ID: Taylor Boyd, female   DOB: 04/22/1976, 39 y.o.   MRN: [id number] Comfortable with epidural now  Filed Vitals:   01/12/15 0233 01/12/15 0235 01/12/15 0238 01/12/15 0240  BP:  122/71  125/76  Pulse: 121 113 110 118  Temp:      TempSrc:      Resp: 18     Height:      Weight:        FHR reassuring with early  decels now  UCs every 2 min  Dilation: 8 Effacement (%): 100 Cervical Position: Middle Station: 0 Presentation: Vertex Exam by:: A. Earna Coderuttle, RNC  Anticipate SVD

## 2015-01-12 NOTE — Anesthesia Preprocedure Evaluation (Signed)
Anesthesia Evaluation  Patient identified by MRN, date of birth, ID band Patient awake    Reviewed: Allergy & Precautions, H&P , Patient's Chart, lab work & pertinent test results  Airway Mallampati: II TM Distance: >3 FB Neck ROM: full    Dental  (+) Teeth Intact   Pulmonary  breath sounds clear to auscultation        Cardiovascular hypertension, Rhythm:regular Rate:Normal     Neuro/Psych    GI/Hepatic   Endo/Other    Renal/GU      Musculoskeletal   Abdominal   Peds  Hematology   Anesthesia Other Findings       Reproductive/Obstetrics (+) Pregnancy                          Anesthesia Physical Anesthesia Plan  ASA: II  Anesthesia Plan: Epidural   Post-op Pain Management:    Induction:   Airway Management Planned:   Additional Equipment:   Intra-op Plan:   Post-operative Plan:   Informed Consent: I have reviewed the patients History and Physical, chart, labs and discussed the procedure including the risks, benefits and alternatives for the proposed anesthesia with the patient or authorized representative who has indicated his/her understanding and acceptance.   Dental Advisory Given  Plan Discussed with:   Anesthesia Plan Comments: (Labs checked- platelets confirmed with RN in room. Fetal heart tracing, per RN, reported to be stable enough for sitting procedure. Discussed epidural, and patient consents to the procedure:  included risk of possible headache,backache, failed block, allergic reaction, and nerve injury. This patient was asked if she had any questions or concerns before the procedure started.)        Anesthesia Quick Evaluation  

## 2015-01-12 NOTE — Anesthesia Procedure Notes (Signed)

## 2015-01-12 NOTE — Anesthesia Postprocedure Evaluation (Signed)
  Anesthesia Post-op Note  Patient: Taylor GreenMaria Hernandez  Procedure(s) Performed: * No procedures listed *  Patient Location: Mother/Baby  Anesthesia Type:Epidural  Level of Consciousness: awake and alert   Airway and Oxygen Therapy: Patient Spontanous Breathing  Post-op Pain: mild  Post-op Assessment: Post-op Vital signs reviewed, Patient's Cardiovascular Status Stable, Respiratory Function Stable, No signs of Nausea or vomiting, Pain level controlled, No headache, No residual numbness and No residual motor weakness  Post-op Vital Signs: Reviewed  Last Vitals:  Filed Vitals:   01/12/15 1745  BP: 126/78  Pulse: 105  Temp: 37.3 C  Resp: 20    Complications: No apparent anesthesia complications

## 2015-01-13 ENCOUNTER — Inpatient Hospital Stay (HOSPITAL_COMMUNITY): Admission: RE | Admit: 2015-01-13 | Payer: Medicaid Other | Source: Ambulatory Visit

## 2015-01-13 LAB — RPR: RPR Ser Ql: NONREACTIVE

## 2015-01-13 MED ORDER — IBUPROFEN 600 MG PO TABS: 600.0000 mg | ORAL_TABLET | Freq: Four times a day (QID) | ORAL | Status: DC | PRN

## 2015-01-13 NOTE — Progress Notes (Addendum)
Checked on patients needs. Ordered dinner and breakfast. Assisted RN with interpretation of baby blood work.  Spanish Interpreter

## 2015-01-13 NOTE — Progress Notes (Signed)
Checked on patients needs.  °Spanish Interpreter  °

## 2015-01-13 NOTE — Discharge Instructions (Signed)
Cuidados en el postparto luego de un parto vaginal  (Postpartum Care After Vaginal Delivery) Despus del parto (perodo de postparto), la estada normal en el hospital es de 24-72 horas. Si hubo problemas con el trabajo de parto o el parto, o si tiene otros problemas mdicos, es posible que Patent attorney en el hospital por ms [place].  Mientras est en el hospital, recibir [place] e instrucciones sobre cmo cuidar de usted misma y de su beb recin nacido durante el postparto.  Mientras est en el hospital:   Asegrese de decirle a las enfermeras si siente dolor o Tree surgeon, as como donde Designer, television/film set y Architect.  Si usted tuvo una incisin cerca de la vagina (episiotoma) o si ha tenido Education officer, museum, las enfermeras le pondrn hielo sobre la episiotoma o Psychiatrist. Las bolsas de hielo pueden ayudar a Dietitian y la hinchazn.  Si est amamantando, puede sentir contracciones dolorosas en el tero durante algunas semanas. Esto es normal. Las contracciones ayudan a que el tero vuelva a su tamao normal.  Es normal tener algo de sangrado despus del Placedo.  Durante los primeros 1-3 das despus del parto, el flujo es de color rojo y la cantidad puede ser similar a un perodo.  Es frecuente que el flujo se inicie y se Production assistant, radio.  En los primeros Okay, puede eliminar algunos cogulos pequeos. Informe a las enfermeras si elimina cogulos grandes o aumenta el flujo.  No  elimine los cogulos de sangre por el inodoro antes de que la [employer] vea.  Durante los prximos 3 a [address] despus del parto, el flujo debe ser ms acuoso y rosado o Forensic psychologist.  Chancy Hurter a catorce [employer] del parto, el flujo debe ser una pequea cantidad de secrecin de color blanco amarillento.  La cantidad de flujo disminuir en las primeras semanas despus del parto. El flujo puede detenerse en 6-8 semanas. La mayora de las mujeres no tienen ms flujo a las 12 semanas  despus del [place].  Usted debe cambiar sus apsitos con frecuencia.  Lvese bien las manos con agua y jabn durante al menos 20 segundos despus de cambiar el apsito, usar el bao o antes de Nature conservation officer o Research scientist (life sciences) a su recin nacido.  Usted podr sentir como que tiene que vaciar la vejiga durante las primeras 6-8 horas despus del [place].  En caso de que sienta debilidad, mareo o [place], llame a la enfermera antes de levantarse de la cama por primera vez y antes de tomar una ducha por primera vez.  Dentro de los Coca[employer]a del parto, sus mamas pueden comenzar a estar sensibles y [place]. Esto se llama congestin. La sensibilidad en los senos por lo general desaparece dentro de las 48-72 horas despus de que ocurre la congestin. Tambin puede notar que la [place] se escapa de sus senos. Si no est amamantando no estimule sus pechos. La estimulacin de las mamas hace que sus senos produzcan ms [place].  Pasar tanto tiempo como le sea posible con el beb recin nacido es muy importante. Durante ese tiempo, usted y su beb deben sentirse cerca y conocerse uno al otro. Tener al beb en su habitacin (alojamiento conjunto) ayudar a fortalecer el vnculo con el beb recin nacido.Esto le dar tiempo para conocerlo y atenderlo de [place] cmoda.  Las hormonas se modifican despus del parto. A veces, los cambios hormonales pueden causar tristeza o ganas de llorar por un tiempo. Estos sentimientos  no deben durar ms de Hughes Supplyunos pocos das. Si duran ms que eso, debe hablar con su mdico.  Si lo desea, hable con su mdico acerca de los mtodos de planificacin familiar o mtodos anticonceptivos.  Hable con su mdico acerca de las vacunas. El mdico puede indicarle que se aplique las siguientes vacunas antes de salir del hospital:  [place] and PrincipeVacuna contra el ttanos, la difteria y la tos ferina (Tdap) o el ttanos y la difteria (Td). Es muy importante que usted y su familia (incluyendo a los abuelos) u otras  personas que cuidan al recin nacido estn al da con las vacunas Tdap o Td. Las vacunas Tdap o Td pueden ayudar a proteger al recin nacido de enfermedades.  Inmunizacin contra la rubola.  Inmunizacin contra la varicela.  Inmunizacin contra la gripe. Usted debe recibir esta vacunacin anual si no la ha recibido Academic librariandurante el embarazo. Document Released: 05/24/2007 Document Revised: 04/20/2012 Operating Room ServicesExitCare Patient Information 2015 [place] FargoExitCare, MarylandLLC. This information is not intended to replace advice given to you by your health care provider. Make sure you discuss any questions you have with your health care provider.  Lactancia materna (Breastfeeding) Decidir Museum/gallery exhibitions officeramamantar es una de las mejores elecciones que puede hacer por usted y su beb. El cambio hormonal durante el Psychiatristembarazo produce el desarrollo del tejido mamario y Lesothoaumenta la cantidad y el tamao de los conductos galactforos. Estas hormonas tambin permiten que las protenas, los azcares y las grasas de la sangre produzcan la WPS Resourcesleche materna en las glndulas productoras de Hamletleche. Las hormonas impiden que la leche materna sea liberada antes del nacimiento del beb, adems de impulsar el flujo de leche luego del nacimiento. Una vez que ha comenzado a Museum/gallery exhibitions officeramamantar, Conservation officer, naturepensar en el beb, as Immunologistcomo la succin o Theatre managerel llanto, pueden estimular la liberacin de Grissom AFBleche de las glndulas productoras de Ingallsleche.  LOS BENEFICIOS DE AMAMANTAR Para el beb  La primera leche (calostro) ayuda a Careers information officermejorar el funcionamiento del sistema digestivo del beb.  La leche tiene anticuerpos que ayudan a Radio producerprevenir las infecciones en el beb.  El beb tiene una menor incidencia de asma, alergias y del sndrome de muerte sbita del lactante.  Los nutrientes en la Nicoma Parkleche materna son mejores para el beb que la Deversleche maternizada y estn preparados exclusivamente para cubrir las necesidades del beb.  La leche materna mejora el desarrollo cerebral del beb.  Es menos probable que el beb  desarrolle otras enfermedades, como obesidad infantil, asma o diabetes mellitus de tipo 2. Para usted   La lactancia materna favorece el desarrollo de un vnculo muy especial entre la madre y el beb.  Es conveniente. La leche materna siempre est disponible a la Human resources officertemperatura correcta y es Marysvilleeconmica.  La lactancia materna ayuda a quemar caloras y a perder el peso ganado durante el New Egyptembarazo.  Favorece la contraccin del tero al tamao que tena antes del embarazo de manera ms rpida y disminuye el sangrado (loquios) despus del parto.  La lactancia materna contribuye a reducir Nurse, adultel riesgo de desarrollar diabetes mellitus de tipo 2, osteoporosis o cncer de mama o de ovario en el futuro. SIGNOS DE QUE EL BEB EST HAMBRIENTO Primeros signos de [address]hambre  Aumenta su estado de Lesothoalerta o actividad.  Se estira.  Mueve la cabeza de un lado a otro.  Mueve la cabeza y abre la boca cuando se le toca la mejilla o la comisura de la boca (reflejo de bsqueda).  Aumenta las vocalizaciones, tales como sonidos de succin, se relame los labios,  emite arrullos, suspiros, o chirridos.  Mueve la Jones Apparel Groupmano hacia la boca.  Se chupa con ganas los dedos o las manos. Signos tardos de Fisher Scientifichambre  Est agitado.  Llora de manera intermitente. Signos de AES Corporationhambre extrema Los signos de hambre extrema requerirn que lo calme y lo consuele antes de que el beb pueda alimentarse adecuadamente. No espere a que se manifiesten los siguientes signos de hambre extrema para comenzar a Museum/gallery exhibitions officeramamantar:   Designer, jewelleryAgitacin.  Llanto intenso y fuerte.   Gritos. INFORMACIN BSICA SOBRE LA LACTANCIA MATERNA Iniciacin de la lactancia materna  Encuentre un lugar cmodo para sentarse o acostarse, con un buen respaldo para el cuello y la espalda.  Coloque una almohada o una manta enrollada debajo del beb para acomodarlo a la altura de la mama (si est sentada). Las almohadas para Museum/gallery exhibitions officeramamantar se han diseado especialmente a fin de servir de apoyo  para los brazos y el beb Smithfield Foodsmientras amamanta.  Asegrese de que el abdomen del beb est frente al suyo.  Masajee suavemente la mama. Con las yemas de los dedos, masajee la pared del pecho hacia el pezn en un movimiento circular. Esto estimula el flujo de Crucibleleche. Es posible que Engineer, manufacturing systemsdeba continuar este movimiento mientras amamanta si la leche fluye lentamente.  Sostenga la mama con el pulgar por arriba del pezn y los otros 4 dedos por debajo de la mama. Asegrese de que los dedos se encuentren lejos del pezn y de la boca del beb.  Empuje suavemente los labios del beb con el pezn o con el dedo.  Cuando la boca del beb se abra lo suficiente, acrquelo rpidamente a la mama e introduzca todo el pezn y la zona oscura que lo rodea (areola), tanto como sea posible, dentro de la boca del beb.  Debe haber ms areola visible por arriba del labio superior del beb que por debajo del labio inferior.  La lengua del beb debe estar entre la enca inferior y la Texicomama.  Asegrese de que la boca del beb est en la posicin correcta alrededor del pezn (prendida). Los labios del beb deben crear un sello sobre la mama y estar doblados hacia afuera (invertidos).  Es comn que el beb succione durante 2 a 3 minutos para que comience el flujo de Landingvilleleche materna. Cmo debe prenderse Es muy importante que le ensee al beb cmo prenderse adecuadamente a la mama. Si el beb no se prende adecuadamente, puede causarle dolor en el pezn y reducir la produccin de Bakersfield Country Clubleche materna, y hacer que el beb tenga un escaso aumento de [place] Lake-Letopeso. Adems, si el beb no se prende adecuadamente al pezn, puede tragar aire durante la alimentacin. Esto puede causarle molestias al beb. Hacer eructar al beb al Pilar Platecambiar de mama puede ayudarlo a liberar el aire. Sin embargo, ensearle al beb cmo prenderse a la mama adecuadamente es la mejor manera de evitar que se sienta molesto por tragar Oceanographeraire mientras se alimenta. Signos de que el beb se  ha prendido adecuadamente al pezn:   Payton Doughtyironea o succiona de modo silencioso, sin causarle dolor.  Se escucha que traga cada 3 o 4 succiones.   Hay movimientos musculares por arriba y por delante de sus odos al Printmakersuccionar. Signos de que el beb no se ha prendido Audiological scientistadecuadamente al pezn:   Hace ruidos de succin o de chasquido mientras se alimenta.  Siente dolor en el pezn. Si cree que el beb no se prendi correctamente, deslice el dedo en la comisura de la boca y colquelo  entre las encas del beb para interrumpir la succin. Intente comenzar a amamantar nuevamente. Signos de Fish farm managerlactancia materna exitosa Signos del beb:   Disminuye gradualmente el nmero de succiones o cesa la succin por completo.  Se duerme.  Relaja el cuerpo.  Retiene una pequea cantidad de Kindred Healthcareleche en la boca.  Se desprende solo del pecho. Signos que presenta usted:  Las mamas han aumentado la firmeza, el peso y el tamao 1 a 3 horas despus de Museum/gallery exhibitions officeramamantar.  Estn ms blandas inmediatamente despus de amamantar.  Un aumento del volumen de McIntyreleche, y tambin un cambio en su consistencia y color se producen hacia el quinto da de Tour managerlactancia materna.  Los pezones no duelen, ni estn agrietados ni sangran. Signos de que su beb recibe la cantidad de leche suficiente  Moja al menos 3 paales en 24 horas. La orina debe ser clara y de color amarillo plido a los [address]  [address] de [place]vida.  Defeca al menos 3 veces en 24 horas a los [address]  [address] de [address]ida. La materia fecal debe ser blanda y Pajarito Mesaamarillenta.  Defeca al menos 3 veces en 24 horas a los [address] das de [address]. La materia fecal debe ser grumosa y Hato Candalamarillenta.  No registra una prdida de peso mayor del 10% del peso al nacer durante los primeros 3 [address]  [address]das de [place]vida.  Aumenta de peso un promedio de 4 a 7onzas (113 a 198g) por semana despus de los 4 [address]  [address]das de vida.  Aumenta de Bradfordpeso, Flushingdiariamente, de Hazardmanera uniforme a Glass blower/designerpartir de los 5 [address]  [address] de vida, sin Passenger transport managerregistrar prdida de peso despus de las  2semanas de vida. Despus de alimentarse, es posible que el beb regurgite una pequea cantidad. Esto es frecuente. FRECUENCIA Y DURACIN DE LA LACTANCIA MATERNA El amamantamiento frecuente la ayudar a producir ms Azerbaijanleche y a Education officer, communityprevenir problemas de Engineer, miningdolor en los pezones e hinchazn en las Town Linemamas. Alimente al beb cuando muestre signos de hambre o si siente la necesidad de reducir la congestin de las [place] Hillmamas. Esto se denomina "lactancia a demanda". Evite el uso del chupete mientras trabaja para establecer la lactancia (las primeras 4 a 6 semanas despus del nacimiento del beb). Despus de este perodo, podr ofrecerle un chupete. Las investigaciones demostraron que el uso del chupete durante el primer ao de vida del beb disminuye el riesgo de desarrollar el sndrome de muerte sbita del lactante (SMSL). Permita que el nio se alimente en cada mama todo lo que desee. Contine amamantando al beb hasta que haya terminado de alimentarse. Cuando el beb se desprende o se queda dormido mientras se est alimentando de la primera mama, ofrzcale la segunda. Debido a que, con frecuencia, los recin Sunoconacidos permanecen somnolientos las primeras semanas de vida, es posible que deba despertar al beb para alimentarlo. Los horarios de Acupuncturistlactancia varan de un beb a otro. Sin embargo, las siguientes reglas pueden servir como gua para ayudarla a Lawyergarantizar que el beb se alimenta adecuadamente:  Se puede amamantar a los recin nacidos (bebs de 4 semanas o menos de vida) cada 1 a 3 horas.  No deben transcurrir ms de 3 horas durante el da o 5 horas durante la noche sin que se amamante a los recin nacidos.  Debe amamantar al beb 8 veces como mnimo en un perodo de 24 horas, hasta que comience a introducir slidos en su dieta, a los 6 meses de vida aproximadamente. EXTRACCIN DE Dean Foods CompanyLECHE MATERNA La extraccin y Contractorel almacenamiento de la leche materna le permiten asegurarse de que el  beb se alimente exclusivamente de  Colgate Palmoliveleche materna, aun en momentos en los que no puede amamantar. Esto tiene especial importancia si debe regresar al Aleen Campitrabajo en el perodo en que an est amamantando o si no puede estar presente en los momentos en que el beb debe alimentarse. Su asesor en lactancia puede orientarla sobre cunto tiempo es seguro almacenar Port Alexanderleche materna.  El sacaleche es un aparato que le permite extraer leche de la mama a un recipiente estril. Luego, la leche materna extrada puede almacenarse en un refrigerador o Electrical engineercongelador. Algunos sacaleches son Birdie Riddlemanuales, Delaney Meigsmientras que otros son elctricos. Consulte a su asesor en lactancia qu tipo ser ms conveniente para usted. Los sacaleches se pueden comprar; sin embargo, algunos hospitales y grupos de apoyo a la lactancia materna alquilan Sports coachsacaleches mensualmente. Un asesor en lactancia puede ensearle cmo extraer W. R. Berkleyleche materna manualmente, en caso de que prefiera no usar un sacaleche.  CMO CUIDAR LAS MAMAS DURANTE LA LACTANCIA MATERNA Los pezones se secan, agrietan y duelen durante la Tour managerlactancia materna. Las siguientes recomendaciones pueden ayudarla a Pharmacologistmantener las TEPPCO Partnersmamas humectadas y sanas:  Careers information officervite usar jabn en los pezones.  Use un sostn de soporte. Aunque no son esenciales, las camisetas sin mangas o los sostenes especiales para Museum/gallery exhibitions officeramamantar estn diseados para acceder fcilmente a las mamas, para Museum/gallery exhibitions officeramamantar sin tener que quitarse todo el sostn o la camiseta. Evite usar sostenes con aro o sostenes muy ajustados.  Seque al aire sus pezones durante 3 a 4minutos despus de amamantar al beb.  Utilice solo apsitos de Haematologistalgodn en el sostn para Environmental health practitionerabsorber las prdidas de Hudsonleche. La prdida de un poco de Public Service Enterprise Groupleche materna entre las tomas es normal.  Utilice lanolina sobre los pezones luego de Museum/gallery exhibitions officeramamantar. La lanolina ayuda a mantener la humedad normal de la piel. Si Botswanausa lanolina pura, no tiene que lavarse los pezones antes de volver a Corporate treasureralimentar al beb. La lanolina pura no es txica para el  beb. Adems, puede extraer Beazer Homesmanualmente algunas gotas de [place] Royaleleche materna y Engineer, maintenance (IT)masajear suavemente esa Winn-Dixieleche sobre los pezones, para que la Terltonleche se seque al aire. Durante las primeras semanas despus de dar a luz, algunas mujeres pueden experimentar hinchazn en las mamas (congestin Strandburgmamaria). La congestin puede hacer que sienta las mamas pesadas, calientes y sensibles al tacto. El pico de la congestin ocurre dentro de los 3 a 5 das despus del Hillsboroughparto. Las siguientes recomendaciones pueden ayudarla a Paramedicaliviar la congestin:  Vace por completo las mamas al QUALCOMMamamantar o Environmental health practitionerextraer leche. Puede aplicar calor hmedo en las mamas (en la ducha o con toallas hmedas para manos) antes de Museum/gallery exhibitions officeramamantar o extraer WPS Resourcesleche. Esto aumenta la circulacin y Saint Vincent and the Grenadinesayuda a que la Holyroodleche fluya. Si el beb no vaca por completo las [address] D[address]amas cuando lo [address] Aveamamanta, extraiga la Judsonialeche restante despus de que haya finalizado.  Use un sostn ajustado (para amamantar o comn) o una camiseta sin mangas durante 1 o 2 das para indicar al cuerpo que disminuya ligeramente la produccin de Olneyleche.  Aplique compresas de hielo Yahoo! Incsobre las mamas, a menos que le resulte demasiado incmodo.  Asegrese de que el beb est prendido y se encuentre en la posicin correcta mientras lo alimenta. Si la congestin persiste luego de 48 horas o despus de seguir estas recomendaciones, comunquese con su mdico o un Holiday representativeasesor en lactancia. RECOMENDACIONES GENERALES PARA EL CUIDADO DE LA SALUD DURANTE LA LACTANCIA MATERNA  Consuma alimentos saludables. Alterne comidas y colaciones, y coma 3 de cada una por da. Dado que lo que come  afecta la Colgate Palmoliveleche materna, es posible que algunas comidas hagan que su beb se vuelva ms irritable de lo habitual. Evite comer este tipo de alimentos si percibe que afectan de manera negativa al beb.  Beba leche, jugos de fruta y agua para Patent examinersatisfacer su sed (aproximadamente 10 vasos al Futures traderda).  Descanse con frecuencia, reljese y tome sus vitaminas  prenatales para evitar la fatiga, el estrs y la anemia.  Contine con los autocontroles de la mama.  Evite masticar y fumar tabaco.  Evite el consumo de alcohol y drogas. Algunos medicamentos, que pueden ser perjudiciales para el beb, pueden pasar a travs de la Colgate Palmoliveleche materna. Es importante que consulte a su mdico antes de Medical sales representativetomar cualquier medicamento, incluidos todos los medicamentos recetados y de Sidneyventa libre, as como los suplementos vitamnicos y herbales. Puede quedar embarazada durante la lactancia. Si desea controlar la natalidad, consulte a su mdico cules son las opciones ms seguras para el beb. SOLICITE ATENCIN MDICA SI:   Usted siente que quiere dejar de Museum/gallery exhibitions officeramamantar o se siente frustrada con la lactancia.  Siente dolor en las mamas o en los pezones.  Sus pezones estn agrietados o Water quality scientistsangran.  Sus pechos estn irritados, sensibles o calientes.  Tiene un rea hinchada en cualquiera de las mamas.  Siente escalofros o fiebre.  Tiene nuseas o vmitos.  Presenta una secrecin de otro lquido distinto de la leche materna de los pezones.  Sus mamas no se llenan antes de Museum/gallery exhibitions officeramamantar al beb para el quinto da despus del Gilbertparto.  Se siente triste y deprimida.  El beb est demasiado somnoliento como para comer bien.  El beb tiene problemas para dormir.  Moja menos de 3 paales en 24 horas.  Defeca menos de 3 veces en 24 horas.  La piel del beb o la parte blanca de los ojos se vuelven amarillentas.  El beb no ha aumentado de Buenapeso a los [address] das de [place]vida. SOLICITE ATENCIN MDICA DE INMEDIATO SI:   El beb est muy cansado Retail buyer(letargo) y no se quiere despertar para comer.  Le sube la fiebre sin causa. Document Released: 07/27/2005 Document Revised: 08/01/2013 [facility] Med CenterExitCare Patient Information 2015 [place] JacintoExitCare, MarylandLLC. This information is not intended to replace advice given to you by your health care provider. Make sure you discuss any questions you have with your health care  provider.

## 2015-01-13 NOTE — Progress Notes (Signed)
Checked on patients needs.  Ordered patient dinner and breakfast. Spanish Interpreter

## 2015-01-13 NOTE — Progress Notes (Signed)
Assisted RN with interpretation of baby analysis.  Spanish Interpreter

## 2015-01-13 NOTE — Discharge Summary (Signed)
Obstetric Discharge Summary Reason for Admission: induction of labor d/t elevated bp in clinic at 40.5  Prenatal Procedures: ultrasound Intrapartum Procedures: spontaneous vaginal delivery Postpartum Procedures: none Complications-Operative and Postpartum: 1st degree perineal laceration w/ repair for hemostasis Eating, drinking, voiding, ambulating well.  +flatus.  Lochia and pain wnl.  Denies dizziness, lightheadedness, or sob. No complaints. Requests early d/c  Hospital Course: Taylor Boyd is a 39 y.o. [phone] female admited at 40.5wks for IOL d/t elevated bp in clinic. Pre-e labs were normal. She received cytotec then pitocin and progressed to svb complicated by 1min shoulder dystocia w/o neonatal deficits. Her pp course has been uncomplicated.  By PPD#1 she is doing well, requests early d/c, and is deemed to have received the full benefit of her hospital stay.  Filed Vitals:   01/13/15 0602  BP: 114/85  Pulse: 72  Temp: 98 F (36.7 C)  Resp: 18   H/H: Lab Results  Component Value Date/Time   HGB 10.8* 01/12/2015 10:00 AM   HGB 11.4* 05/29/2013 08:04 PM   HCT 32.4* 01/12/2015 10:00 AM   HCT 37.0* 05/29/2013 08:04 PM    Physical Exam: General: alert, cooperative and no distress Abdomen/Uterine Fundus: Appropriately tender, non-distended, FF @ U-2 Incision: n/a Lochia: appropriate Extremities: No evidence of DVT seen on physical exam. Negative Homan's sign, no cords, calf tenderness, or significant calf/ankle edema   Discharge Diagnoses: Term Pregnancy-delivered  Discharge Information: Date: 02/19/2011 Activity: pelvic rest Diet: routine  Medications: PNV and Ibuprofen Breast feeding: Yes Contraception: condoms Circumcision: n/a Condition: stable Instructions: refer to handout Discharge to: home  Infant: Home with mother  Follow-up Information    Schedule an appointment as soon as possible for a visit with [facility] [place].   Why:  4-6 weeks for your  postpartum visit   Contact information:   [address] [facility] [place] [id number] [phone]      Marge DuncansBooker, Bodey Frizell Randall, CNM, [facility] Hospital Of MiamiWHNP-BC 01/13/2015,7:23 AM

## 2015-01-13 NOTE — Progress Notes (Signed)
Assisted RN with interpretation of breastfeeding instructions.  Spanish Interpreter

## 2015-01-13 NOTE — Progress Notes (Signed)
Checked on patients needs.  °Spanish interpreter  °

## 2015-01-14 ENCOUNTER — Ambulatory Visit: Payer: Self-pay

## 2015-01-14 ENCOUNTER — Other Ambulatory Visit: Payer: Self-pay | Admitting: Obstetrics and Gynecology

## 2015-01-14 NOTE — Progress Notes (Signed)
Post discharge chart review completed.  

## 2015-01-14 NOTE — Lactation Note (Addendum)
This note was copied from the chart of [name]. Lactation Consultation Note Experienced BF mom has BF her other 2 children for over 1 yr. Each. Did both breast and bottle at first then when milk comes in good just breast. Is breast and bottle feeding this baby as well. Moms breast are filling. INTERPRETER present asked mom questions or concerns, also did teaching  About supplementing, putting baby to breast first, supply and demand, engorgement, BF, milk supply, breast care and massage. Mom encouraged to feed baby 8-12 times/24 hours and with feeding cues. Referred to Baby and Me Book in Breastfeeding section Pg. 22-23 for position options and Proper latch demonstration. Educated about newborn behavior in Spanish book. WH/LC brochure given w/resources, support groups and LC services. Patient Name: [name] ZOXWR'UToday's Date: 01/14/2015 Reason for consult: Initial assessment   Maternal Data Has patient been taught Hand Expression?: Yes Does the patient have breastfeeding experience prior to this delivery?: Yes  Feeding Feeding Type: Breast Fed Length of feed: 10 min  LATCH Score/Interventions Latch: Grasps breast easily, tongue down, lips flanged, rhythmical sucking.  Audible Swallowing: Spontaneous and intermittent  Type of Nipple: Everted at rest and after stimulation  Comfort (Breast/Nipple): Filling, red/small blisters or bruises, mild/mod discomfort  Problem noted: Mild/Moderate discomfort;Filling  Hold (Positioning): Assistance needed to correctly position infant at breast and maintain latch. Intervention(s): Breastfeeding basics reviewed;Support Pillows;Position options;Skin to skin (w/interpreter)  LATCH Score: 8  Lactation Tools Discussed/Used WIC Program: Yes   Consult Status Consult Status: Complete Date: 01/14/15    Charyl DancerCARVER, Luisenrique Conran G 01/14/2015, 2:08 AM

## 2016-07-19 IMAGING — US US OB FOLLOW-UP
1 series · 12 of 28 positions shown · non-contrast
Comparison: none

[Series 1: us ob follow up · 94 acquisitions, 12 frames shown]
[im 4/94]
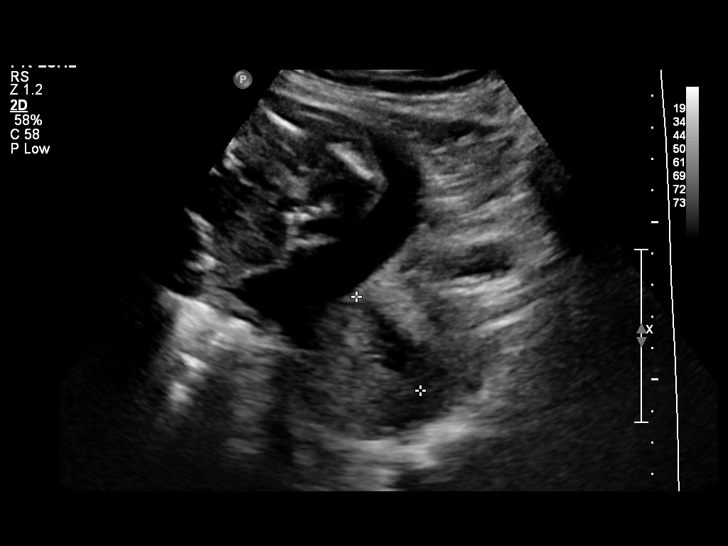
[im 11/94]
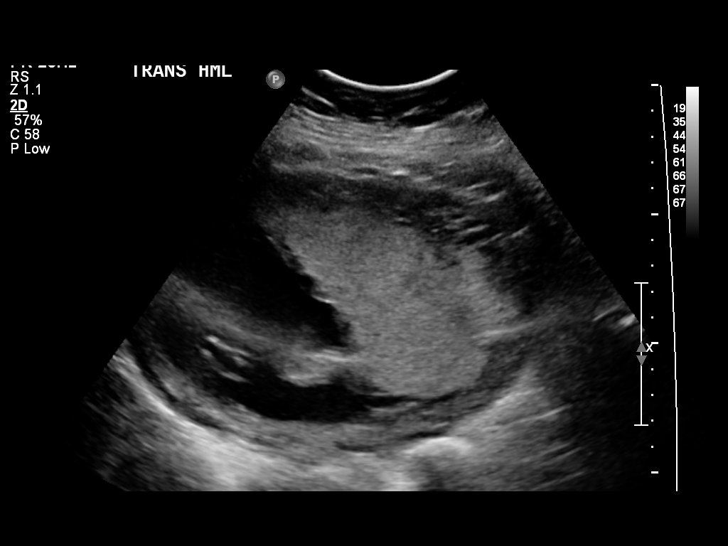
[im 18/94]
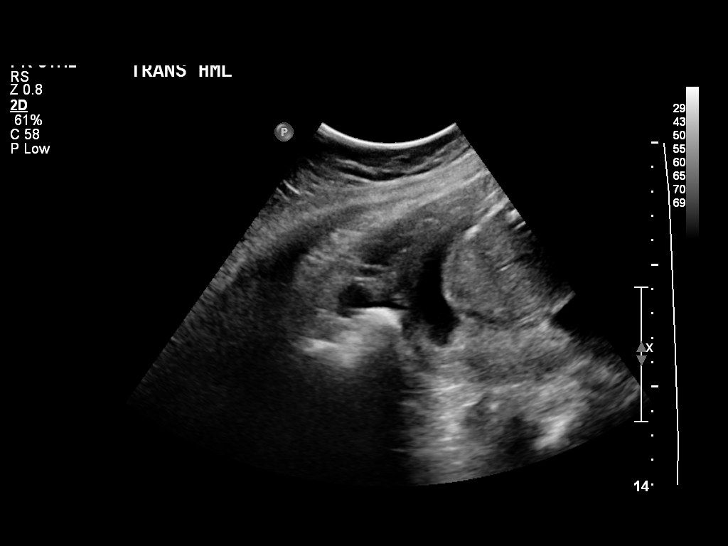
[im 28/94]
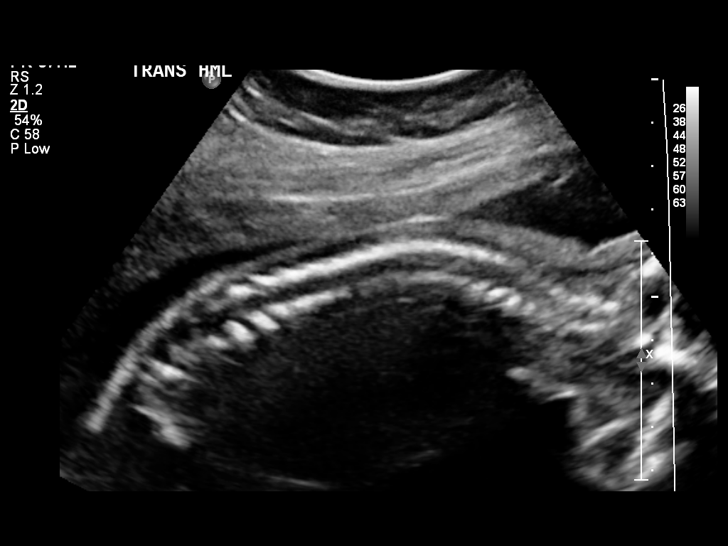
[im 35/94]
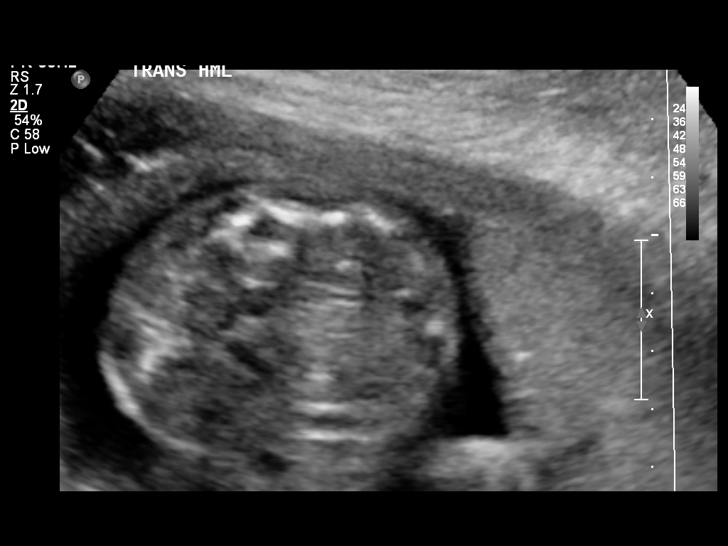
[im 42/94]
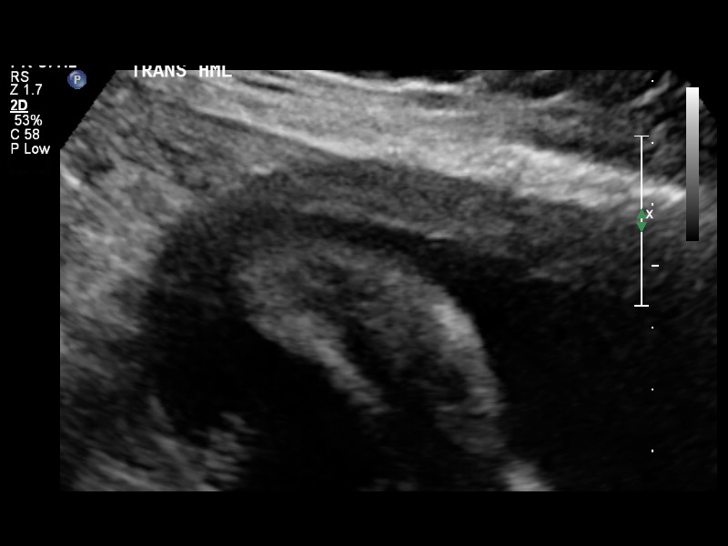
[im 52/94]
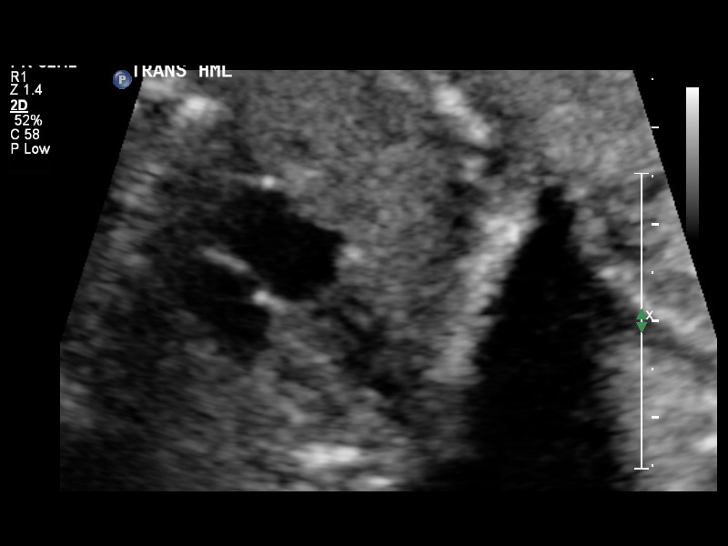
[im 59/94]
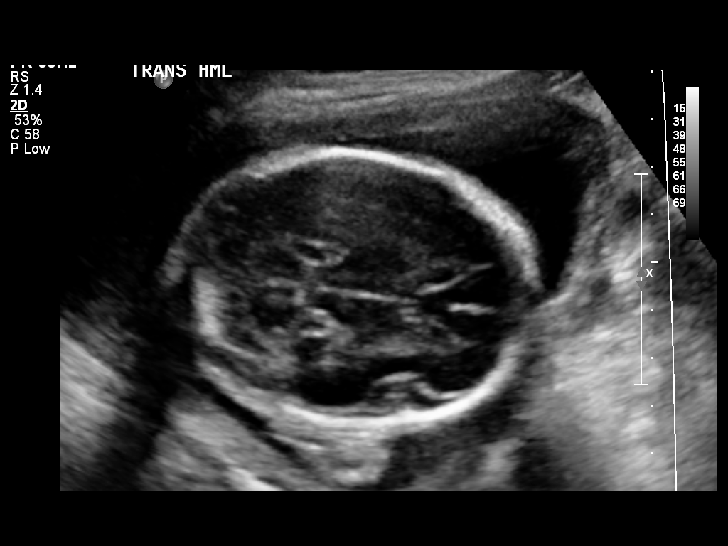
[im 66/94]
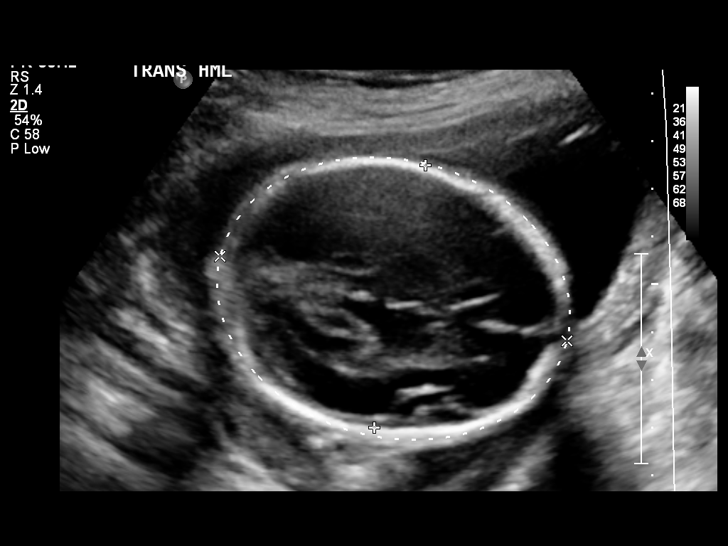
[im 76/94]
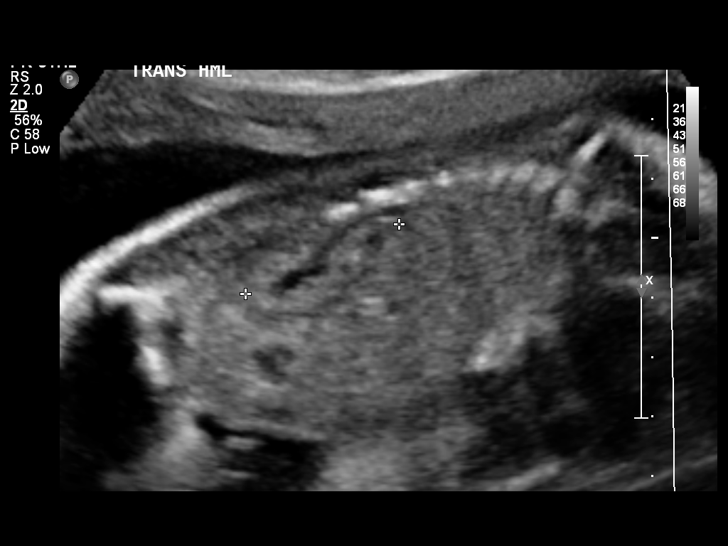
[im 83/94]
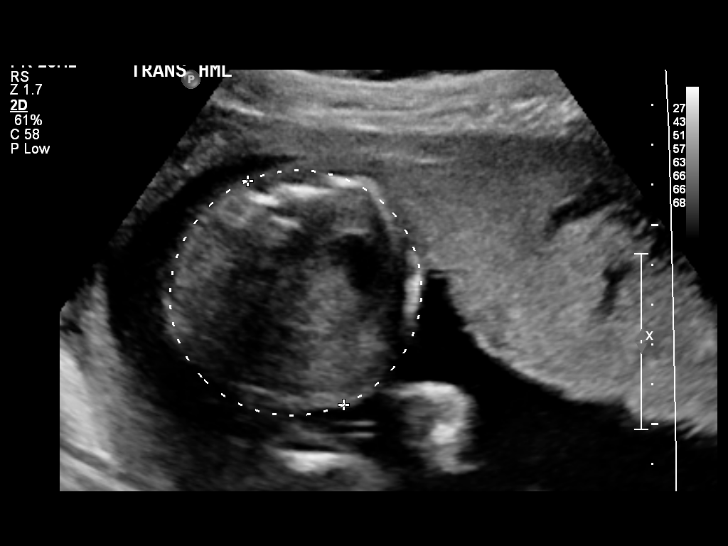
[im 90/94]
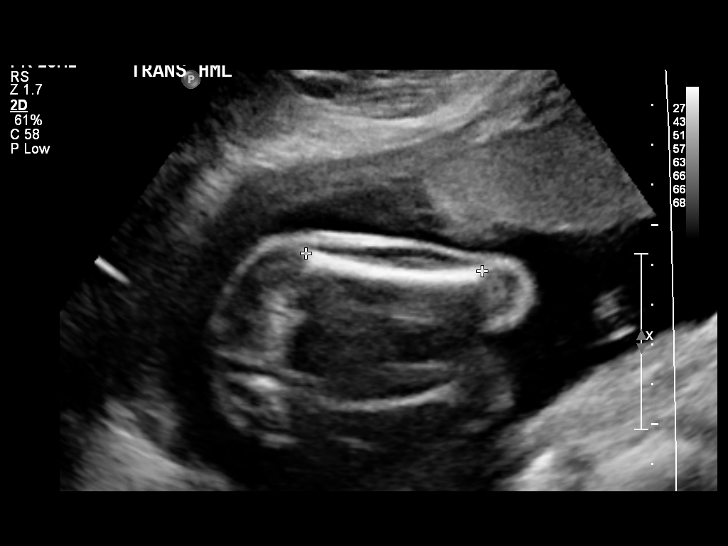

[12 of 28 positions shown; findings below may reference images not displayed]

OBSTETRICS REPORT
                      (Signed Final 09/07/2014 [DATE])

                                                         Faculty Physician
Service(s) Provided

 US OB FOLLOW UP                                       76816.1
Indications

 Advanced maternal age multigravida 36, second
 trimester
 Follow-up incomplete fetal anatomic evaluation        Z36
 22 weeks gestation of pregnancy
Fetal Evaluation

 Num Of Fetuses:    1
 Fetal Heart Rate:  152                          bpm
 Cardiac Activity:  Observed
 Presentation:      Transverse, head to
                    maternal left
 Placenta:          Anterior, above cervical os
 P. Cord            Visualized
 Insertion:

 Amniotic Fluid
 AFI FV:      Subjectively within normal limits
                                             Larg Pckt:     5.1  cm
Biometry

 BPD:     55.7  mm     G. Age:  23w 0d                CI:        71.01   70 - 86
                                                      FL/HC:      20.6   19.2 -

 HC:     210.6  mm     G. Age:  23w 1d       54  %    HC/AC:      1.09   1.05 -

 AC:       194  mm     G. Age:  24w 1d       82  %    FL/BPD:     77.9   71 - 87
 FL:      43.4  mm     G. Age:  24w 2d       84  %    FL/AC:      22.4   20 - 24
 HUM:       39  mm     G. Age:  23w 6d       71  %
 CER:     22.9  mm     G. Age:  21w 3d       23  %

 Est. FW:     650  gm      1 lb 7 oz     71  %
Gestational Age

 LMP:           22w 5d        Date:  04/01/14                 EDD:   01/06/15
 U/S Today:     23w 4d                                        EDD:   12/31/14
 Best:          22w 5d     Det. By:  LMP  (04/01/14)          EDD:   01/06/15
Anatomy

 Cranium:          Appears normal         Ductal Arch:      Appears normal
 Fetal Cavum:      Appears normal         Diaphragm:        Appears normal
 Ventricles:       Appears normal         Stomach:          Appears normal, left
                                                            sided
 Choroid Plexus:   Appears normal         Abdomen:          Appears normal
 Cerebellum:       Appears normal         Abdominal Wall:   Appears nml (cord
                                                            insert, abd wall)
 Posterior Fossa:  Appears normal         Cord Vessels:     Previously seen
 Nuchal Fold:      Not applicable (>20    Kidneys:          Appear normal
                   wks GA)
 Lips:             Previously seen        Bladder:          Appears normal
 Heart:            Echogenic focus        Spine:            Appears normal
                   in LV
 RVOT:             Appears normal         Lower             Previously seen
                                          Extremities:
 LVOT:             Appears normal         Upper             Previously seen
                                          Extremities:
 Aortic Arch:      Appears normal

 Other:  Fetus appears to be a female.
Targeted Anatomy

 Fetal Central Nervous System
 Lat. Ventricles:  4.3                    Cisterna Magna:
Cervix Uterus Adnexa

 Cervical Length:    3.59     cm

 Cervix:       Normal appearance by transabdominal scan.
 Uterus:       No abnormality visualized.
 Left Ovary:    Not visualized.
 Right Ovary:   Not visualized.
 Adnexa:     No adnexal mass visualized.
Impression

 Single IUP at 22w 5d
 Echogenic intracardiac focus is again noted
 Normal interval anatomy.  The fetal anatomic survey is now
 complete
 Fetal growth is appropriate (71st %tile)
 Anterior placenta without previa
 Normal amniotic fluid volume
Recommendations

 Follow-up ultrasounds as clinically indicated.

## 2021-11-27 ENCOUNTER — Telehealth: Payer: Self-pay

## 2021-11-27 DIAGNOSIS — Z3490 Encounter for supervision of normal pregnancy, unspecified, unspecified trimester: Secondary | ICD-10-CM

## 2021-12-01 NOTE — Telephone Encounter (Signed)
Called pt with interpreter Raquel. Pt reports sure LMP of 09/12/21. Pt reports menstrual cycles range from 4-8 weeks in length. By LMP pt would be 11w 3d today with EDD of 06/19/22. Pt scheduled for dating [place] tomorrow to confirm dates. Will change new OB appt following [place] result if needed. ?

## 2021-12-01 NOTE — Telephone Encounter (Signed)
Referral received from [facility] Department for new OB appt. No dating information available, pt unsure of LMP. Pt to be scheduled for dating [place] prior to new OB appt being scheduled. ?

## 2021-12-02 ENCOUNTER — Ambulatory Visit (INDEPENDENT_AMBULATORY_CARE_PROVIDER_SITE_OTHER): Payer: Self-pay | Admitting: Family Medicine

## 2021-12-02 ENCOUNTER — Other Ambulatory Visit: Payer: Self-pay | Admitting: Family Medicine

## 2021-12-02 ENCOUNTER — Ambulatory Visit (INDEPENDENT_AMBULATORY_CARE_PROVIDER_SITE_OTHER): Payer: Self-pay

## 2021-12-02 DIAGNOSIS — Z3490 Encounter for supervision of normal pregnancy, unspecified, unspecified trimester: Secondary | ICD-10-CM

## 2021-12-02 DIAGNOSIS — O3680X0 Pregnancy with inconclusive fetal viability, not applicable or unspecified: Secondary | ICD-10-CM

## 2021-12-02 NOTE — Assessment & Plan Note (Signed)
Reviewed [place] results with patient that overall there is concern for nonviable pregnancy. There is no fetal cardiac activity at present, and with CRL about 5-6 mm, just below 8mm cutoff for definitive failed pregnancy. However, given full constellation of imaging findings including enlarged gestational sac and irregular shaped structures I am not optimistic. Gave patient the choice of follow up US to confirm diagnosis vs active management of nonviable pregnancy, she is undecided and will call the clinic once she has made a decision.  ?

## 2021-12-02 NOTE — Progress Notes (Signed)
? ?  GYNECOLOGY OFFICE VISIT NOTE ? ?History:  ? Brianne Maina is a 46 y.o. [phone] here today for [place] results. ? ?Patient has felt some abdominal cramping like her period was about to start ?No vaginal bleeding ? ?Health Maintenance Due  ?Topic Date Due  ? Hepatitis C Screening  Never done  ? PAP SMEAR-Modifier  05/29/2016  ? COVID-19 Vaccine (3 - Booster for Pfizer series) 01/08/2020  ? ? ?Past Medical History:  ?Diagnosis Date  ? AMA (advanced maternal age) multigravida 35+   ? Anemia   ? ? ?Past Surgical History:  ?Procedure Laterality Date  ? NO PAST SURGERIES    ? ? ?The following portions of the patient's history were reviewed and updated as appropriate: allergies, current medications, past family history, past medical history, past social history, past surgical history and problem list.  ? ?Health Maintenance:   ?Last pap: ?No results found for: DIAGPAP, HPV, HPVHIGH ? ? ?Last mammogram:  ?  ? ?Review of Systems:  ?Pertinent items noted in HPI and remainder of comprehensive ROS otherwise negative. ? ?Physical Exam:  ?BP 128/86   Pulse 83  ?CONSTITUTIONAL: Well-developed, well-nourished female in no acute distress.  ?HEENT:  Normocephalic, atraumatic. External right and left ear normal. No scleral icterus.  ?NECK: Normal range of motion, supple, no masses noted on observation ?SKIN: No rash noted. Not diaphoretic. No erythema. No pallor. ?MUSCULOSKELETAL: Normal range of motion. No edema noted. ?NEUROLOGIC: Alert and oriented to person, place, and time. Normal muscle tone coordination.  ?PSYCHIATRIC: Normal mood and affect. Normal behavior. Normal judgment and thought content. ?RESPIRATORY: Effort normal, no problems with respiration noted ? ? ?Labs and Imaging ?No results found for this or any previous visit (from the past 168 hour(s)). ?No results found.    ?Assessment and Plan:  ? ?Problem List Items Addressed This Visit   ? ?  ? Other  ? Pregnancy with uncertain fetal viability  ?  Reviewed [place]  results with patient that overall there is concern for nonviable pregnancy. There is no fetal cardiac activity at present, and with CRL about 5-6 mm, just below 22mm cutoff for definitive failed pregnancy. However, given full constellation of imaging findings including enlarged gestational sac and irregular shaped structures I am not optimistic. Gave patient the choice of follow up US to confirm diagnosis vs active management of nonviable pregnancy, she is undecided and will call the clinic once she has made a decision.  ? ?  ?  ? ? ? ? ?Total face-to-face time with patient: 20 minutes.  Over 50% of encounter was spent on counseling and coordination of care. ? ? ?Venora Maples, MD/MPH ?Attending Family Medicine Physician, Faculty Practice ?Center for [employer], [facility] ? ? ?

## 2021-12-04 ENCOUNTER — Encounter (HOSPITAL_COMMUNITY): Payer: Self-pay | Admitting: Obstetrics and Gynecology

## 2021-12-04 ENCOUNTER — Inpatient Hospital Stay (HOSPITAL_COMMUNITY)
Admission: AD | Admit: 2021-12-04 | Discharge: 2021-12-04 | Disposition: A | Discharge status: Home / Self Care | Payer: Self-pay | Attending: Obstetrics and Gynecology | Admitting: Obstetrics and Gynecology

## 2021-12-04 ENCOUNTER — Other Ambulatory Visit: Payer: Self-pay

## 2021-12-04 DIAGNOSIS — Z3A11 11 weeks gestation of pregnancy: Secondary | ICD-10-CM

## 2021-12-04 DIAGNOSIS — O0289 Other abnormal products of conception: Secondary | ICD-10-CM

## 2021-12-04 DIAGNOSIS — O039 Complete or unspecified spontaneous abortion without complication: Secondary | ICD-10-CM | POA: Insufficient documentation

## 2021-12-04 HISTORY — DX: Hypothyroidism, unspecified: E03.9

## 2021-12-04 MED ORDER — OXYCODONE HCL 5 MG PO TABS: 5.0000 mg | ORAL_TABLET | ORAL | 0 refills | Status: AC | PRN

## 2021-12-04 MED ORDER — ONDANSETRON 4 MG PO TBDP: 4.0000 mg | ORAL_TABLET | Freq: Three times a day (TID) | ORAL | 0 refills | Status: AC | PRN

## 2021-12-04 MED ORDER — MISOPROSTOL 200 MCG PO TABS
800.0000 ug | ORAL_TABLET | Freq: Once | ORAL | Status: AC
  Administered 2021-12-04: 800 ug via BUCCAL
  Filled 2021-12-04: qty 4

## 2021-12-04 MED ORDER — ONDANSETRON HCL 4 MG PO TABS
8.0000 mg | ORAL_TABLET | Freq: Once | ORAL | Status: AC
  Administered 2021-12-04: 8 mg via ORAL
  Filled 2021-12-04: qty 2

## 2021-12-04 NOTE — MAU Note (Addendum)
Taylor Boyd is a 46 y.o. at Unknown here in MAU reporting: having a little bit of bleeding.  Started last night, saw little more this morning.  Seeing when she wipes.  No clots.  Little pain in the RLQ ?LMP: 2/3 ?Onset of complaint: last night ?Pain score: moderate ?Vitals:  ? 12/04/21 0941  ?BP: (!) 152/85  ?Pulse: 77  ?Resp: 17  ?Temp: 98.6 ?F (37 ?C)  ?SpO2: 100%  ?   ? ?Lab orders placed from triage:  none ? ? ?FYI: pt had [place] on 4/25, no cardiac activity noted. ?

## 2021-12-04 NOTE — MAU Provider Note (Signed)
?History  ?  ? ?[id number] ? ?Arrival date and time: 12/04/21 [id number] ?  ? ?Chief Complaint  ?Patient presents with  ? Vaginal Bleeding  ? Abdominal Pain  ? ? ? ?HPI ?Taylor Boyd is a 46 y.o. at [redacted]w[redacted]d by LMP with PMHx notable for NSVD x3, AMA, who presents for vaginal bleeding.  ? ? ?I saw patient in clinic two days prior ?At that time had viability [place] that showed findings overall consistent with nonviable pregnancy but borderline criteria, given option of medication management vs repeat US ?She did not make a decision and told us she would call the clinic ? ?Today reports she has started to have vaginal bleeding and cramping that started last night ?She is very worried about this being a sign of true miscarriage ?No other symptoms ? ? ? ?  ? ?OB History   ? ? Gravida  ?5  ? Para  ?3  ? Term  ?3  ? Preterm  ?   ? AB  ?1  ? Living  ?3  ?  ? ? SAB  ?1  ? IAB  ?   ? Ectopic  ?   ? Multiple  ?0  ? Live Births  ?3  ?   ?  ?  ? ? ?Past Medical History:  ?Diagnosis Date  ? AMA (advanced maternal age) multigravida 5+   ? Anemia   ? Hypothyroidism   ? ? ?Past Surgical History:  ?Procedure Laterality Date  ? NO PAST SURGERIES    ? ? ?Family History  ?Problem Relation Age of Onset  ? Healthy Mother   ? Healthy Father   ? Alcohol abuse Neg Hx   ? Arthritis Neg Hx   ? Asthma Neg Hx   ? Birth defects Neg Hx   ? Cancer Neg Hx   ? COPD Neg Hx   ? Depression Neg Hx   ? Diabetes Neg Hx   ? Drug abuse Neg Hx   ? Early death Neg Hx   ? Hearing loss Neg Hx   ? Heart disease Neg Hx   ? Hyperlipidemia Neg Hx   ? Hypertension Neg Hx   ? Kidney disease Neg Hx   ? Learning disabilities Neg Hx   ? Mental illness Neg Hx   ? Mental retardation Neg Hx   ? Miscarriages / Stillbirths Neg Hx   ? Vision loss Neg Hx   ? Stroke Neg Hx   ? Varicose Veins Neg Hx   ? ? ?Social History  ? ?Socioeconomic History  ? Marital status: Single  ?  Spouse name: Not on file  ? Number of children: Not on file  ? Years of education: Not on file  ? Highest  education level: Not on file  ?Occupational History  ? Occupation: home maker  ?Tobacco Use  ? Smoking status: Never  ? Smokeless tobacco: Not on file  ?Vaping Use  ? Vaping Use: Never used  ?Substance and Sexual Activity  ? Alcohol use: No  ? Drug use: No  ? Sexual activity: Yes  ?Other Topics Concern  ? Not on file  ?Social History Narrative  ? Moved to [place] 7 years ago from [place].  ? ?Social Determinants of Health  ? ?Financial Resource Strain: Not on file  ?Food Insecurity: Not on file  ?Transportation Needs: Not on file  ?Physical Activity: Not on file  ?Stress: Not on file  ?Social Connections: Not on file  ?Intimate Partner Violence: Not on file  ? ? ?  No Known Allergies ? ?No current facility-administered medications on file prior to encounter.  ? ?Current Outpatient Medications on File Prior to Encounter  ?Medication Sig Dispense Refill  ? propylthiouracil (PTU) 50 MG tablet Take 100 mg by mouth 3 (three) times daily. 50mg , take 2, 3 times a day    ? Prenatal Vit-Fe Fumarate-FA (PRENATAL MULTIVITAMIN) TABS tablet Take 1 tablet by mouth daily at 12 noon.    ? ? ? ?ROS ?Pertinent positives and negative per HPI, all others reviewed and negative ? ?Physical Exam  ? ?BP (!) 152/85 (BP Location: Right Arm)   Pulse 77   Temp 98.6 ?F (37 ?C) (Oral)   Resp 17   Wt 87.4 kg   LMP 09/12/2021   SpO2 100%   BMI 32.07 kg/m?  ? ?Patient Vitals for the past 24 hrs: ? BP Temp Temp src Pulse Resp SpO2 Weight  ?12/04/21 0941 (!) 152/85 98.6 ?F (37 ?C) Oral 77 17 100 % 87.4 kg  ? ? ?Physical Exam ?Vitals reviewed.  ?Constitutional:   ?   General: She is not in acute distress. ?   Appearance: She is well-developed. She is not diaphoretic.  ?Eyes:  ?   General: No scleral icterus. ?Pulmonary:  ?   Effort: Pulmonary effort is normal. No respiratory distress.  ?Abdominal:  ?   General: There is no distension.  ?   Palpations: Abdomen is soft.  ?   Tenderness: There is no abdominal tenderness. There is no guarding or rebound.   ?Skin: ?   General: Skin is warm and dry.  ?Neurological:  ?   Mental Status: She is alert.  ?   Coordination: Coordination normal.  ?  ? ?Cervical Exam ?  ? ?Bedside Ultrasound ?Pt informed that the ultrasound is considered a limited OB ultrasound and is not intended to be a complete ultrasound exam.  Patient also informed that the ultrasound is not being completed with the intent of assessing for fetal or placental anomalies or any pelvic abnormalities.  Explained that the purpose of today?s ultrasound is to assess for  viability.  Patient acknowledges the purpose of the exam and the limitations of the study.   ? ?My interpretation: IUP, large amount of heterogenous material throughout the endometrium. IUGS with small possible fetal pole without fetal cardiac activity noted is present towards the lower uterine segment, compared to images from two day priors this is a change as IUGS was at the fundus ? ? ?Labs ?No results found for this or any previous visit (from the past 24 hour(s)). ? ?Imaging ?No results found. ? ?MAU Course  ?Procedures ?Lab Orders  ?No laboratory test(s) ordered today  ? ?Meds ordered this encounter  ?Medications  ? misoprostol (CYTOTEC) tablet 800 mcg  ? ondansetron (ZOFRAN) tablet 8 mg  ? ondansetron (ZOFRAN-ODT) 4 MG disintegrating tablet  ?  Sig: Take 1 tablet (4 mg total) by mouth every 8 (eight) hours as needed for nausea or vomiting.  ?  Dispense:  20 tablet  ?  Refill:  0  ? oxyCODONE (ROXICODONE) 5 MG immediate release tablet  ?  Sig: Take 1 tablet (5 mg total) by mouth every 4 (four) hours as needed for severe pain.  ?  Dispense:  4 tablet  ?  Refill:  0  ? ?Imaging Orders  ?No imaging studies ordered today  ? ? ?MDM ?moderate ? ?Assessment and Plan  ?#Nonviable pregnancy ?#Miscarriage ?#[redacted] weeks gestation of pregnancy ?Given bleeding, large amount of blood  in endometrial cavity, and movement of IUGS from fundus towards the lower uterine segment, discussed with patient this is  consistent with miscarriage.  ? ?Reviewed options of expectant, medical, or surgical management. After counseling she elected for medical management. Given misoprostol 800 mcg buccal in the MAU and zofran. Also sent rx for zofran and 4 tabs of 5mg  Oxycodone to her pharmacy. Reviewed that cramping, bleeding are normal in the first few hours after taking the medication, but should eventually wane. Reviewed warning signs of heavy vaginal bleeding soaking through >1 pad per hour, crescendo abdominal pain, and fever. . Blood type O+ , rhogam was not indicated. ? ? ? ?Dispo: discharged to home in stable condition. ? ? ?Clarnce Flock, MD/MPH ?12/04/21 ?10:54 AM ? ?Allergies as of 12/04/2021   ?No Known Allergies ?  ? ?  ?Medication List  ?  ? ?TAKE these medications   ? ?ondansetron 4 MG disintegrating tablet ?Commonly known as: ZOFRAN-ODT ?Take 1 tablet (4 mg total) by mouth every 8 (eight) hours as needed for nausea or vomiting. ?  ?oxyCODONE 5 MG immediate release tablet ?Commonly known as: Roxicodone ?Take 1 tablet (5 mg total) by mouth every 4 (four) hours as needed for severe pain. ?  ?prenatal multivitamin Tabs tablet ?Take 1 tablet by mouth daily at 12 noon. ?  ?propylthiouracil 50 MG tablet ?Commonly known as: PTU ?Take 100 mg by mouth 3 (three) times daily. 50mg , take 2, 3 times a day ?  ? ?  ? ? ?

## 2022-01-02 ENCOUNTER — Ambulatory Visit (INDEPENDENT_AMBULATORY_CARE_PROVIDER_SITE_OTHER): Payer: Self-pay | Admitting: Certified Nurse Midwife

## 2022-01-02 ENCOUNTER — Encounter: Payer: Self-pay | Admitting: Certified Nurse Midwife

## 2022-01-02 VITALS — BP 130/86 | HR 93 | Ht 64.0 in | Wt 197.3 lb

## 2022-01-02 DIAGNOSIS — O039 Complete or unspecified spontaneous abortion without complication: Secondary | ICD-10-CM

## 2022-01-02 NOTE — Progress Notes (Signed)
   Subjective:   Patient Name: Taylor Boyd, female   DOB: 09/30/1975, 46 y.o.  MRN: [id number]  HPI Seen for follow up of SAB 4 weeks ago. Denies bleeding, cramping. Menses has not returned yet. Does not plan on becoming pregnant soon.     Review of Systems     Objective:   Physical Exam  Constitutional: She is oriented to person, place, and time. She appears well-developed and well-nourished.  Cardiovascular: Normal rate.  Abdominal: Soft. She exhibits no distension.  Neurological: She is alert and oriented to person, place, and time.  Skin: Skin is warm and dry.  Psychiatric: She has a normal mood and affect. Her behavior is normal. Judgment and thought content normal.      Assessment & Plan:  1. SAB (spontaneous abortion) Patient counseled regarding future pregnancy plans and timing  Contraception: Planning condoms use.  Patient sees her PCP at [facility] on [address] for her Thyroid and Annual Visits. Continue to follow up with them.   Claudette Head MSN, CNM 01/02/22  9:18 AM
